# Patient Record
Sex: Male | Born: 1965
Health system: Southern US, Community
[De-identification: ages and names within clinical notes are randomized; demographics above are authoritative.]

## PROBLEM LIST (undated history)

## (undated) DIAGNOSIS — I10 Essential (primary) hypertension: Secondary | ICD-10-CM

## (undated) DIAGNOSIS — R7303 Prediabetes: Secondary | ICD-10-CM

## (undated) DIAGNOSIS — J111 Influenza due to unidentified influenza virus with other respiratory manifestations: Secondary | ICD-10-CM

## (undated) DIAGNOSIS — M779 Enthesopathy, unspecified: Secondary | ICD-10-CM

## (undated) DIAGNOSIS — N200 Calculus of kidney: Secondary | ICD-10-CM

## (undated) HISTORY — DX: Prediabetes: R73.03

## (undated) HISTORY — DX: Essential (primary) hypertension: I10

---

## 2017-01-13 ENCOUNTER — Encounter (HOSPITAL_COMMUNITY): Payer: Self-pay | Admitting: Emergency Medicine

## 2017-01-13 ENCOUNTER — Emergency Department (HOSPITAL_COMMUNITY)
Admission: EM | Admit: 2017-01-13 | Discharge: 2017-01-13 | Disposition: A | Discharge status: Home / Self Care | Payer: Self-pay | Attending: Emergency Medicine | Admitting: Emergency Medicine

## 2017-01-13 DIAGNOSIS — R51 Headache: Secondary | ICD-10-CM | POA: Insufficient documentation

## 2017-01-13 DIAGNOSIS — R200 Anesthesia of skin: Secondary | ICD-10-CM | POA: Insufficient documentation

## 2017-01-13 DIAGNOSIS — F17200 Nicotine dependence, unspecified, uncomplicated: Secondary | ICD-10-CM | POA: Insufficient documentation

## 2017-01-13 DIAGNOSIS — R197 Diarrhea, unspecified: Secondary | ICD-10-CM | POA: Insufficient documentation

## 2017-01-13 DIAGNOSIS — M2550 Pain in unspecified joint: Secondary | ICD-10-CM | POA: Insufficient documentation

## 2017-01-13 DIAGNOSIS — M545 Low back pain: Secondary | ICD-10-CM | POA: Insufficient documentation

## 2017-01-13 HISTORY — DX: Influenza due to unidentified influenza virus with other respiratory manifestations: J11.1

## 2017-01-13 HISTORY — DX: Enthesopathy, unspecified: M77.9

## 2017-01-13 HISTORY — DX: Calculus of kidney: N20.0

## 2017-01-13 LAB — COMPREHENSIVE METABOLIC PANEL
ALK PHOS: 61 U/L (ref 38–126)
ALT: 34 U/L (ref 17–63)
AST: 41 U/L (ref 15–41)
Albumin: 3.8 g/dL (ref 3.5–5.0)
Anion gap: 7 (ref 5–15)
BILIRUBIN TOTAL: 0.6 mg/dL (ref 0.3–1.2)
BUN: 12 mg/dL (ref 6–20)
CHLORIDE: 106 mmol/L (ref 101–111)
CO2: 23 mmol/L (ref 22–32)
CREATININE: 0.86 mg/dL (ref 0.61–1.24)
Calcium: 8.7 mg/dL — ABNORMAL LOW (ref 8.9–10.3)
GFR calc Af Amer: >60 mL/min (ref >60)
Glucose, Bld: 114 mg/dL — ABNORMAL HIGH (ref 65–99)
Potassium: 4 mmol/L (ref 3.5–5.1)
Sodium: 136 mmol/L (ref 135–145)
TOTAL PROTEIN: 6.8 g/dL (ref 6.5–8.1)

## 2017-01-13 LAB — CBC
HCT: 45.1 % (ref 39.0–52.0)
Hemoglobin: 14.8 g/dL (ref 13.0–17.0)
MCH: 29.4 pg (ref 26.0–34.0)
MCHC: 32.8 g/dL (ref 30.0–36.0)
MCV: 89.7 fL (ref 78.0–100.0)
PLATELETS: 230 10*3/uL (ref 150–400)
RBC: 5.03 MIL/uL (ref 4.22–5.81)
RDW: 13 % (ref 11.5–15.5)
WBC: 6.1 10*3/uL (ref 4.0–10.5)

## 2017-01-13 LAB — LIPASE, BLOOD: Lipase: 22 U/L (ref 11–51)

## 2017-01-13 MED ORDER — ACETAMINOPHEN 500 MG PO TABS
1000.0000 mg | ORAL_TABLET | Freq: Once | ORAL | Status: AC
  Administered 2017-01-13: 1000 mg via ORAL
  Filled 2017-01-13: qty 2

## 2017-01-13 NOTE — Discharge Instructions (Signed)
Make sure that you drink at least six 8 ounce glasses of water or Gatorade each day in order to stay well-hydrated. Avoid milk or foods containing most she is or ice cream all having diarrhea. You can take Tylenol for aches and can take Imodium as directed for diarrhea. Call any of the numbers on these discharge instructions to get a primary care physician . Return if concern for any reason or you can see an urgent care center

## 2017-01-13 NOTE — ED Provider Notes (Signed)
[facility] Provider Note   CSN: [id number] Arrival date & time: 01/13/17  1227     History   Chief Complaint Chief Complaint  Patient presents with  . Diarrhea  . Headache  . Numbness    HPI Alex [name] is a 51 y.o. male.Patient complains of diarrhea 7 times today, no blood per rectum, no hematemesis. He vomited one time today. No nausea present. He ate bacon and eggs today. He denies abdominal pain. He also complains of diffuse headache and low back pain for several weeks as well as bilateral ankle pain and swelling for several weeks. He also complains of numbness in little finger of left hand and numbness in index middle and ring fingers of right hand for several weeks. No treatment prior to coming here. No other associated symptoms. No fever no recent travel or antibiotics  HPI  Past Medical History:  Diagnosis Date  . Bone spur   . Flu   . Kidney stone     There are no active problems to display for this patient.   History reviewed. No pertinent surgical history.     Home Medications    Prior to Admission medications   Not on File    Family History No family history on file.  Social History Social History  Substance Use Topics  . Smoking status: Current Some Day Smoker  . Smokeless tobacco: Never Used  . Alcohol use No     Comment: former quit January 2018   Former user of cocaine and marijuana. Last used cocaine end of April 2018. No history of IV drug use  Allergies   Patient has no known allergies.   Review of Systems Review of Systems  Constitutional: Negative.   HENT: Negative.   Respiratory: Negative.   Cardiovascular: Positive for leg swelling.  Gastrointestinal: Negative.   Musculoskeletal: Positive for arthralgias.  Skin: Negative.   Neurological: Positive for numbness and headaches.  Psychiatric/Behavioral: Negative.   All other systems reviewed and are negative.    Physical Exam Updated Vital Signs BP 128/73    Pulse 79   Temp 99.3 F (37.4 C) (Oral)   Resp (!) 24   Ht 6\' 2"  (1.88 m)   Wt 127.5 kg (281 lb)   SpO2 99%   BMI 36.08 kg/m   Physical Exam  Constitutional: He appears well-developed and well-nourished.  HENT:  Head: Normocephalic and atraumatic.  Eyes: Conjunctivae are normal. Pupils are equal, round, and reactive to light.  Neck: Neck supple. No tracheal deviation present. No thyromegaly present.  Cardiovascular: Normal rate and regular rhythm.   No murmur heard. Pulmonary/Chest: Effort normal and breath sounds normal.  Abdominal: Soft. Bowel sounds are normal. He exhibits no distension. There is no tenderness.  Obese  Musculoskeletal: Normal range of motion. He exhibits no edema or tenderness.  Entire spine is nontender. All 4 extremities without redness swelling or tenderness neurovascularly intact. I didn't detect no peripheral edema.  Neurological: He is alert. Coordination normal.  Motor strength 5 over 5 overall. Grip strength 5 over 5 bilaterally. All fingers with good capillary refill.  Skin: Skin is warm and dry. No rash noted.  Psychiatric: He has a normal mood and affect.  Nursing note and vitals reviewed.    ED Treatments / Results  Labs (all labs ordered are listed, but only abnormal results are displayed) Labs Reviewed  COMPREHENSIVE METABOLIC PANEL - Abnormal; Notable for the following:       Result Value   Glucose,  Bld 114 (*)    Calcium 8.7 (*)    All other components within normal limits  LIPASE, BLOOD  CBC   Results for orders placed or performed during the hospital encounter of 01/13/17  Lipase, blood  Result Value Ref Range   Lipase 22 11 - 51 U/L  Comprehensive metabolic panel  Result Value Ref Range   Sodium 136 135 - 145 mmol/L   Potassium 4.0 3.5 - 5.1 mmol/L   Chloride 106 101 - 111 mmol/L   CO2 23 22 - 32 mmol/L   Glucose, Bld 114 (H) 65 - 99 mg/dL   BUN 12 6 - 20 mg/dL   Creatinine, Ser 0.450.86 0.61 - 1.24 mg/dL   Calcium 8.7 (L) 8.9  - 10.3 mg/dL   Total Protein 6.8 6.5 - 8.1 g/dL   Albumin 3.8 3.5 - 5.0 g/dL   AST 41 15 - 41 U/L   ALT 34 17 - 63 U/L   Alkaline Phosphatase 61 38 - 126 U/L   Total Bilirubin 0.6 0.3 - 1.2 mg/dL   GFR calc non Af Amer >60 >60 mL/min   GFR calc Af Amer >60 >60 mL/min   Anion gap 7 5 - 15  CBC  Result Value Ref Range   WBC 6.1 4.0 - 10.5 K/uL   RBC 5.03 4.22 - 5.81 MIL/uL   Hemoglobin 14.8 13.0 - 17.0 g/dL   HCT 40.945.1 81.139.0 - 91.452.0 %   MCV 89.7 78.0 - 100.0 fL   MCH 29.4 26.0 - 34.0 pg   MCHC 32.8 30.0 - 36.0 g/dL   RDW 78.213.0 95.611.5 - 21.315.5 %   Platelets 230 150 - 400 K/uL   No results found. EKG  EKG Interpretation None       Radiology No results found.  Procedures Procedures (including critical care time)  Medications Ordered in ED Medications  acetaminophen (TYLENOL) tablet 1,000 mg (not administered)   Results for orders placed or performed during the hospital encounter of 01/13/17  Lipase, blood  Result Value Ref Range   Lipase 22 11 - 51 U/L  Comprehensive metabolic panel  Result Value Ref Range   Sodium 136 135 - 145 mmol/L   Potassium 4.0 3.5 - 5.1 mmol/L   Chloride 106 101 - 111 mmol/L   CO2 23 22 - 32 mmol/L   Glucose, Bld 114 (H) 65 - 99 mg/dL   BUN 12 6 - 20 mg/dL   Creatinine, Ser 0.860.86 0.61 - 1.24 mg/dL   Calcium 8.7 (L) 8.9 - 10.3 mg/dL   Total Protein 6.8 6.5 - 8.1 g/dL   Albumin 3.8 3.5 - 5.0 g/dL   AST 41 15 - 41 U/L   ALT 34 17 - 63 U/L   Alkaline Phosphatase 61 38 - 126 U/L   Total Bilirubin 0.6 0.3 - 1.2 mg/dL   GFR calc non Af Amer >60 >60 mL/min   GFR calc Af Amer >60 >60 mL/min   Anion gap 7 5 - 15  CBC  Result Value Ref Range   WBC 6.1 4.0 - 10.5 K/uL   RBC 5.03 4.22 - 5.81 MIL/uL   Hemoglobin 14.8 13.0 - 17.0 g/dL   HCT 57.845.1 46.939.0 - 62.952.0 %   MCV 89.7 78.0 - 100.0 fL   MCH 29.4 26.0 - 34.0 pg   MCHC 32.8 30.0 - 36.0 g/dL   RDW 52.813.0 41.311.5 - 24.415.5 %   Platelets 230 150 - 400 K/uL   No results found.  Initial  Impression /  Assessment and Plan / ED Course  I have reviewed the triage vital signs and the nursing notes.  Pertinent labs & imaging results that were available during my care of the patient were reviewed by me and considered in my medical decision making (see chart for details).     Numbness in fingers is nonspecific. He has no motor deficit. I detect no peripheral edema. Exam is essentially normal except for obesity. Neurologic exam is nonfocal and normal. Plan avoid dairy, encourage oral hydration. Tylenol for aches. Imodium for diarrhea. Referral primary care  Final Clinical Impressions(s) / ED Diagnoses  Diagnosis #1 diarrhea #2 arthralgias #3 paresthesia #4 nonspecific headache #5 obesity Final diagnoses:  Diarrhea, unspecified type    New Prescriptions New Prescriptions   No medications on file     Doug Sou, MD 01/13/17 1624

## 2017-01-13 NOTE — ED Triage Notes (Signed)
Pt reports numbness to finger tips and right hand ongoing for a couple of weeks. Diarrhea and headache onset today.

## 2017-01-30 ENCOUNTER — Ambulatory Visit (HOSPITAL_COMMUNITY)
Admission: EM | Admit: 2017-01-30 | Discharge: 2017-01-30 | Disposition: A | Discharge status: Home / Self Care | Payer: Self-pay | Attending: Family Medicine | Admitting: Family Medicine

## 2017-01-30 ENCOUNTER — Encounter (HOSPITAL_COMMUNITY): Payer: Self-pay | Admitting: Emergency Medicine

## 2017-01-30 DIAGNOSIS — R600 Localized edema: Secondary | ICD-10-CM

## 2017-01-30 MED ORDER — FUROSEMIDE 40 MG PO TABS: 40.0000 mg | ORAL_TABLET | Freq: Every day | ORAL | 0 refills | Status: DC

## 2017-01-30 MED ORDER — DICLOFENAC SODIUM 50 MG PO TBEC: 50.0000 mg | DELAYED_RELEASE_TABLET | Freq: Two times a day (BID) | ORAL | 0 refills | Status: DC

## 2017-01-30 NOTE — ED Provider Notes (Signed)
  [facility]   [id number] 01/30/17 Arrival Time: 1719  ASSESSMENT & PLAN:  1. Lower leg edema   - acute on chronic  Meds ordered this encounter  Medications  . furosemide (LASIX) 40 MG tablet    Sig: Take 1 tablet (40 mg total) by mouth daily. Take in the morning.    Dispense:  30 tablet    Refill:  0  . diclofenac (VOLTAREN) 50 MG EC tablet    Sig: Take 1 tablet (50 mg total) by mouth 2 (two) times daily. For inflammation/pain.    Dispense:  20 tablet    Refill:  0   Has compression stockings he will start using once a family member brings them to him. Lasix QD for 3-4 days then stop. May repeat as needed. Monitor BP.  Reviewed expectations re: course of current medical issues. Questions answered. Outlined signs and symptoms indicating need for more acute intervention. Follow up here or in the Emergency Department if worsening. Patient verbalized understanding. After Visit Summary given.  SUBJECTIVE:  Alex [name] is a 51 y.o. male who presents with complaint of fairly longstanding LE edema. Past few years. Has times where it bothers him more, esp with persistent standing. 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 Works at [employer] and stands most of the day. Has used compression stockings in past but they are at his home a few hours away. Occasional LE joint pains. No LE sensation changes. Ambulatory without problem.  ROS: As per HPI.   OBJECTIVE:  Vitals:   01/30/17 1729  BP: (!) 173/68  Pulse: 77  Resp: 20  Temp: 98.5 F (36.9 C)  TempSrc: Oral  SpO2: 98%     General appearance: alert, cooperative, appears stated age and no distress Lungs: clear to auscultation bilaterally Heart: regular rate and rhythm Abdomen: soft, non-tender; bowel sounds normal; no masses,  no organomegaly; no guarding or rebound tenderness Extremities: bilat LE 1+ pitting edema; FROM at knee and ankle Skin: Skin color, texture, turgor normal. No rashes  or lesions Lymph nodes: no lymphadenopathy Neurologic: Alert and oriented X 3, normal strength and tone. Normal symmetric reflexes. Normal gait   No Known Allergies  PMHx, SurgHx, SocialHx, Medications, and Allergies were reviewed in the Visit Navigator and updated as appropriate.       Mardella LaymanHagler, Vikash Nest, MD 01/30/17 [phone]

## 2017-01-30 NOTE — ED Notes (Signed)
Patient discharged by provider.

## 2017-01-30 NOTE — ED Triage Notes (Signed)
[facility] a complaint of pain and swelling to both lower legs.

## 2017-03-19 ENCOUNTER — Emergency Department (HOSPITAL_COMMUNITY)
Admission: EM | Admit: 2017-03-19 | Discharge: 2017-03-19 | Disposition: A | Discharge status: Home / Self Care | Payer: Self-pay | Attending: Emergency Medicine | Admitting: Emergency Medicine

## 2017-03-19 ENCOUNTER — Encounter (HOSPITAL_COMMUNITY): Payer: Self-pay | Admitting: Emergency Medicine

## 2017-03-19 DIAGNOSIS — Y9259 Other trade areas as the place of occurrence of the external cause: Secondary | ICD-10-CM | POA: Insufficient documentation

## 2017-03-19 DIAGNOSIS — S60941A Unspecified superficial injury of left index finger, initial encounter: Secondary | ICD-10-CM | POA: Insufficient documentation

## 2017-03-19 DIAGNOSIS — Y9389 Activity, other specified: Secondary | ICD-10-CM | POA: Insufficient documentation

## 2017-03-19 DIAGNOSIS — Z79899 Other long term (current) drug therapy: Secondary | ICD-10-CM | POA: Insufficient documentation

## 2017-03-19 DIAGNOSIS — W540XXA Bitten by dog, initial encounter: Secondary | ICD-10-CM | POA: Insufficient documentation

## 2017-03-19 DIAGNOSIS — Y992 Volunteer activity: Secondary | ICD-10-CM | POA: Insufficient documentation

## 2017-03-19 DIAGNOSIS — Z87891 Personal history of nicotine dependence: Secondary | ICD-10-CM | POA: Insufficient documentation

## 2017-03-19 DIAGNOSIS — Z23 Encounter for immunization: Secondary | ICD-10-CM | POA: Insufficient documentation

## 2017-03-19 MED ORDER — TETANUS-DIPHTH-ACELL PERTUSSIS 5-2.5-18.5 LF-MCG/0.5 IM SUSP
0.5000 mL | Freq: Once | INTRAMUSCULAR | Status: AC
  Administered 2017-03-19: 0.5 mL via INTRAMUSCULAR
  Filled 2017-03-19: qty 0.5

## 2017-03-19 NOTE — ED Provider Notes (Signed)
[facility] Provider Note   CSN: [id number] Arrival date & time: 03/19/17  1720  By signing my name below, I, Diona BrownerJennifer Gorman, attest that this documentation has been prepared under the direction and in the presence of Mathews RobinsonsJessica Hager [place], PA-C. Electronically Signed: Diona BrownerJennifer Gorman, ED Scribe. 03/19/17. 7:09 PM.  History   Chief Complaint Chief Complaint  Patient presents with  . Animal Bite    HPI Alex Koyanagihomas A [name] is a 51 y.o. male with a PMHx of bilateral leg edema, who presents to the Emergency Department complaining of a sudden dog bite to his left hand that occurred ~ 10:30 am today while working at an [employer]. [facility], for a dog bite on his right hand. He was not given a tetanus shot or rabies vaccine at that time. The vaccination status of both the dogs (chihuahua) is unknown. 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 Tetanus is NOT UTD. No hx of HTN or DM. No allergies to mediations. Pt denies fever, nausea, vomiting, or any other complaints at this time.   The history is provided by the patient. No language interpreter was used.    Past Medical History:  Diagnosis Date  . Bone spur   . Flu   . Kidney stone     There are no active problems to display for this patient.   History reviewed. No pertinent surgical history.     Home Medications    Prior to Admission medications   Medication Sig Start Date End Date Taking? Authorizing Provider  diclofenac (VOLTAREN) 50 MG EC tablet Take 1 tablet (50 mg total) by mouth 2 (two) times daily. For inflammation/pain. 01/30/17   Mardella LaymanHagler, Brian, MD  furosemide (LASIX) 40 MG tablet Take 1 tablet (40 mg total) by mouth daily. Take in the morning. 01/30/17   Mardella LaymanHagler, Brian, MD    Family History No family history on file.  Social History Social History  Substance Use Topics  . Smoking  status: Former Smoker    Quit date: 02/16/2017  . Smokeless tobacco: Never Used  . Alcohol use No     Comment: former quit January 2018     Allergies   Patient has no known allergies.   Review of Systems Review of Systems  Constitutional: Negative for chills and fever.  Respiratory: Negative for shortness of breath and wheezing.   Cardiovascular: Negative for chest pain and palpitations.  Gastrointestinal: Negative for nausea and vomiting.  Musculoskeletal: Negative for arthralgias and myalgias.  Skin: Positive for wound. Negative for color change, pallor and rash.  Neurological: Negative for dizziness, weakness, light-headedness, numbness and headaches.     Physical Exam Updated Vital Signs BP 124/66 (BP Location: Left Arm)   Pulse (!) 58   Temp 98.4 F (36.9 C) (Oral)   Resp 18   Ht 6\' 2"  (1.88 m)   Wt 135.2 kg (298 lb)   SpO2 99%   BMI 38.26 kg/m   Physical Exam  Constitutional: He appears well-developed and well-nourished. No distress.  Patient is afebrile, non-toxic appearing, sitting comfortably in chair in no acute distress.  HENT:  Head: Normocephalic and atraumatic.  Eyes: Conjunctivae are normal.  Neck: Normal range of motion.  Cardiovascular: Normal rate, regular rhythm and normal heart sounds.   Pulmonary/Chest: Effort normal and breath sounds normal.  Abdominal: He exhibits no distension.  Musculoskeletal:  Normal range of motion.  Superficial 2 mm by 1 mm wound to left index finger medially. No bleeding. No evidence of deep puncture. FROM. No swelling or surrounding erythema.   Neurological: He is alert.  Skin: He is not diaphoretic. No pallor.  Psychiatric: He has a normal mood and affect. His behavior is normal.  Nursing note and vitals reviewed.    ED Treatments / Results  DIAGNOSTIC STUDIES: Oxygen Saturation is 99% on RA, normal by my interpretation.   COORDINATION OF CARE: 7:09 PM-Discussed next steps with pt. Pt verbalized  understanding and is agreeable with the plan.   Labs (all labs ordered are listed, but only abnormal results are displayed) Labs Reviewed - No data to display  EKG  EKG Interpretation None       Radiology No results found.  Procedures Procedures (including critical care time)  Medications Ordered in ED Medications  Tdap (BOOSTRIX) injection 0.5 mL (0.5 mLs Intramuscular Given 03/19/17 1927)     Initial Impression / Assessment and Plan / ED Course  I have reviewed the triage vital signs and the nursing notes.  Pertinent labs & imaging results that were available during my care of the patient were reviewed by me and considered in my medical decision making (see chart for details).     Patient presents with small abrasion from chihuahua tooth which occurred earlier today. No deep puncture wound.  Patient reports that the dog is being quarantined and observed at this time.  Tetanus was updated while in the emergency department.  I do not think the patient needs to have rabies series started at this time. Patient was discussed with Dr. Judd Lien who agrees with assessment and plan.  Patient is well-appearing, nontoxic and afebrile. Will discharge patient home with PCP follow-up as needed.  Discussed strict return precautions and advised to return to the emergency department if experiencing any new or worsening symptoms. Instructions were understood and patient agreed with discharge plan.  Final Clinical Impressions(s) / ED Diagnoses   Final diagnoses:  Dog bite, initial encounter    New Prescriptions New Prescriptions   No medications on file   I personally performed the services described in this documentation, which was scribed in my presence. The recorded information has been reviewed and is accurate.     Georgiana Shore, PA-C 03/19/17 1940    Geoffery Lyons, MD 03/19/17 [phone]

## 2017-03-19 NOTE — ED Triage Notes (Signed)
Pt reports dog bite on L hand today while working at [employer]. Pt reports  Dog bite on Thursday on R hand, [facility] but did not receive tetanus or rabies vaccine.  Pt reports vaccination status of dogs unknown.

## 2017-03-19 NOTE — Discharge Instructions (Signed)
As discussed, monitor for any signs of infection. Return to be seen at the area becomes red, swollen, more painful, purulence, we developed a fever, chills or any other concerning symptoms. Your tetanus was updated today.   Follow-up with her primary care provider as needed.

## 2017-03-19 NOTE — ED Notes (Signed)
Patient Alert and oriented X4. Stable and ambulatory. Patient verbalized understanding of the discharge instructions.  Patient belongings were taken by the patient.  

## 2017-04-04 ENCOUNTER — Ambulatory Visit (INDEPENDENT_AMBULATORY_CARE_PROVIDER_SITE_OTHER): Payer: Self-pay | Admitting: Family Medicine

## 2017-04-04 ENCOUNTER — Encounter: Payer: Self-pay | Admitting: Family Medicine

## 2017-04-04 VITALS — BP 142/66 | HR 78 | Temp 98.0°F | Resp 16 | Ht 74.0 in | Wt 299.0 lb

## 2017-04-04 DIAGNOSIS — G629 Polyneuropathy, unspecified: Secondary | ICD-10-CM

## 2017-04-04 DIAGNOSIS — I10 Essential (primary) hypertension: Secondary | ICD-10-CM

## 2017-04-04 DIAGNOSIS — M79671 Pain in right foot: Secondary | ICD-10-CM

## 2017-04-04 DIAGNOSIS — M25562 Pain in left knee: Secondary | ICD-10-CM

## 2017-04-04 DIAGNOSIS — G8929 Other chronic pain: Secondary | ICD-10-CM

## 2017-04-04 DIAGNOSIS — M25561 Pain in right knee: Secondary | ICD-10-CM

## 2017-04-04 DIAGNOSIS — M79641 Pain in right hand: Secondary | ICD-10-CM

## 2017-04-04 LAB — POCT URINALYSIS DIP (DEVICE)
Bilirubin Urine: NEGATIVE
Glucose, UA: NEGATIVE mg/dL
KETONES UR: NEGATIVE mg/dL
Nitrite: NEGATIVE
PROTEIN: NEGATIVE mg/dL
SPECIFIC GRAVITY, URINE: 1.02 (ref 1.005–1.030)
UROBILINOGEN UA: 1 mg/dL (ref 0.0–1.0)
pH: 6.5 (ref 5.0–8.0)

## 2017-04-04 LAB — POCT GLYCOSYLATED HEMOGLOBIN (HGB A1C): Hemoglobin A1C: 6

## 2017-04-04 MED ORDER — HYDROCHLOROTHIAZIDE 25 MG PO TABS: 25.0000 mg | ORAL_TABLET | Freq: Every day | ORAL | 3 refills | Status: DC

## 2017-04-04 MED ORDER — GABAPENTIN 300 MG PO CAPS
300.0000 mg | ORAL_CAPSULE | Freq: Three times a day (TID) | ORAL | 3 refills | Status: DC
Start: 2017-04-04 — End: 2018-12-05

## 2017-04-04 MED ORDER — DICLOFENAC SODIUM 50 MG PO TBEC: 50.0000 mg | DELAYED_RELEASE_TABLET | Freq: Two times a day (BID) | ORAL | 0 refills | Status: DC

## 2017-04-04 NOTE — Patient Instructions (Signed)
Foot, ankle, knee, heel, neck pain I have prescribed you gabapentin 300 mg three times daily. Medication may cause drowsiness, take first dose at bedtime.  I have prescribed Diclofenac 50 mg twice day to reduce inflammation.  I have also prescribed hydrochlorothiazide 25 mg once daily to reduce fluid retention.  Your hemoglobin A1C 6.0 indicates prediabetes. Increase physical activity to prevent progression to diabetes.    Return 1 week for fasting labs ( avoid eating for at least 8 hours) and I will recheck your blood pressure.     Hypertension Hypertension is another name for high blood pressure. High blood pressure forces your heart to work harder to pump blood. This can cause problems over time. There are two numbers in a blood pressure reading. There is a top number (systolic) over a bottom number (diastolic). It is best to have a blood pressure below 120/80. Healthy choices can help lower your blood pressure. You may need medicine to help lower your blood pressure if:  Your blood pressure cannot be lowered with healthy choices.  Your blood pressure is higher than 130/80.  Follow these instructions at home: Eating and drinking  If directed, follow the DASH eating plan. This diet includes: ? Filling half of your plate at each meal with fruits and vegetables. ? Filling one quarter of your plate at each meal with whole grains. Whole grains include whole wheat pasta, brown rice, and whole grain bread. ? Eating or drinking low-fat dairy products, such as skim milk or low-fat yogurt. ? Filling one quarter of your plate at each meal with low-fat (lean) proteins. Low-fat proteins include fish, skinless chicken, eggs, beans, and tofu. ? Avoiding fatty meat, cured and processed meat, or chicken with skin. ? Avoiding premade or processed food.  Eat less than 1,500 mg of salt (sodium) a day.  Limit alcohol use to no more than 1 drink a day for nonpregnant women and 2 drinks a day for men.  One drink equals 12 oz of beer, 5 oz of wine, or 1 oz of hard liquor. Lifestyle  Work with your doctor to stay at a healthy weight or to lose weight. Ask your doctor what the best weight is for you.  Get at least 30 minutes of exercise that causes your heart to beat faster (aerobic exercise) most days of the week. This may include walking, swimming, or biking.  Get at least 30 minutes of exercise that strengthens your muscles (resistance exercise) at least 3 days a week. This may include lifting weights or pilates.  Do not use any products that contain nicotine or tobacco. This includes cigarettes and e-cigarettes. If you need help quitting, ask your doctor.  Check your blood pressure at home as told by your doctor.  Keep all follow-up visits as told by your doctor. This is important. Medicines  Take over-the-counter and prescription medicines only as told by your doctor. Follow directions carefully.  Do not skip doses of blood pressure medicine. The medicine does not work as well if you skip doses. Skipping doses also puts you at risk for problems.  Ask your doctor about side effects or reactions to medicines that you should watch for. Contact a doctor if:  You think you are having a reaction to the medicine you are taking.  You have headaches that keep coming back (recurring).  You feel dizzy.  You have swelling in your ankles.  You have trouble with your vision. Get help right away if:  You get a very  bad headache.  You start to feel confused.  You feel weak or numb.  You feel faint.  You get very bad pain in your: ? Chest. ? Belly (abdomen).  You throw up (vomit) more than once.  You have trouble breathing. Summary  Hypertension is another name for high blood pressure.  Making healthy choices can help lower blood pressure. If your blood pressure cannot be controlled with healthy choices, you may need to take medicine. This information is not intended to  replace advice given to you by your health care provider. Make sure you discuss any questions you have with your health care provider. Document Released: 01/10/2008 Document Revised: 06/21/2016 Document Reviewed: 06/21/2016 Elsevier Interactive Patient Education  2018 [employer].     Prediabetes Prediabetes is the condition of having a blood sugar (blood glucose) level that is higher than it should be, but not high enough for you to be diagnosed with type 2 diabetes. Having prediabetes puts you at risk for developing type 2 diabetes (type 2 diabetes mellitus). Prediabetes may be called impaired glucose tolerance or impaired fasting glucose. Prediabetes usually does not cause symptoms. Your health care provider can diagnose this condition with blood tests. You may be tested for prediabetes if you are overweight and if you have at least one other risk factor for prediabetes. Risk factors for prediabetes include:  Having a family member with type 2 diabetes.  Being overweight or obese.  Being older than age 63.  Being of American-Indian, African-American, Hispanic/Latino, or Asian/Pacific Islander descent.  Having an inactive (sedentary) lifestyle.  Having a history of gestational diabetes or polycystic ovarian syndrome (PCOS).  Having low levels of good cholesterol (HDL-C) or high levels of blood fats (triglycerides).  Having high blood pressure.  What is blood glucose and how is blood glucose measured?  Blood glucose refers to the amount of glucose in your bloodstream. Glucose comes from eating foods that contain sugars and starches (carbohydrates) that the body breaks down into glucose. Your blood glucose level may be measured in mg/dL (milligrams per deciliter) or mmol/L (millimoles per liter).Your blood glucose may be checked with one or more of the following blood tests:  A fasting blood glucose (FBG) test. You will not be allowed to eat (you will fast) for at least 8 hours  before a blood sample is taken. ? A normal range for FBG is 70-100 mg/dl (9.5-6.2 mmol/L).  An A1c (hemoglobin A1c) blood test. This test provides information about blood glucose control over the previous 2?3months.  An oral glucose tolerance test (OGTT). This test measures your blood glucose twice: ? After fasting. This is your baseline level. ? Two hours after you drink a beverage that contains glucose.  You may be diagnosed with prediabetes:  If your FBG is 100?125 mg/dL (1.3-0.8 mmol/L).  If your A1c level is 5.7?6.4%.  If your OGGT result is 140?199 mg/dL (6.5-78 mmol/L).  These blood tests may be repeated to confirm your diagnosis. What happens if blood glucose is too high? The pancreas produces a hormone (insulin) that helps move glucose from the bloodstream into cells. When cells in the body do not respond properly to insulin that the body makes (insulin resistance), excess glucose builds up in the blood instead of going into cells. As a result, high blood glucose (hyperglycemia) can develop, which can cause many complications. This is a symptom of prediabetes. What can happen if blood glucose stays higher than normal for a long time? Having high blood glucose for  a long time is dangerous. Too much glucose in your blood can damage your nerves and blood vessels. Long-term damage can lead to complications from diabetes, which may include:  Heart disease.  Stroke.  Blindness.  Kidney disease.  Depression.  Poor circulation in the feet and legs, which could lead to surgical removal (amputation) in severe cases.  How can prediabetes be prevented from turning into type 2 diabetes?  To help prevent type 2 diabetes, take the following actions:  Be physically active. ? Do moderate-intensity physical activity for at least 30 minutes on at least 5 days of the week, or as much as told by your health care provider. This could be brisk walking, biking, or water aerobics. ? Ask  your health care provider what activities are safe for you. A mix of physical activities may be best, such as walking, swimming, cycling, and strength training.  Lose weight as told by your health care provider. ? Losing 5-7% of your body weight can reverse insulin resistance. ? Your health care provider can determine how much weight loss is best for you and can help you lose weight safely.  Follow a healthy meal plan. This includes eating lean proteins, complex carbohydrates, fresh fruits and vegetables, low-fat dairy products, and healthy fats. ? Follow instructions from your health care provider about eating or drinking restrictions. ? Make an appointment to see a diet and nutrition specialist (registered dietitian) to help you create a healthy eating plan that is right for you.  Do not smoke or use any tobacco products, such as cigarettes, chewing tobacco, and e-cigarettes. If you need help quitting, ask your health care provider.  Take over-the-counter and prescription medicines as told by your health care provider. You may be prescribed medicines that help lower the risk of type 2 diabetes.  This information is not intended to replace advice given to you by your health care provider. Make sure you discuss any questions you have with your health care provider. Document Released: 11/15/2015 Document Revised: 12/30/2015 Document Reviewed: 09/14/2015 Elsevier Interactive Patient Education  [employer].

## 2017-04-04 NOTE — Progress Notes (Signed)
Patient ID: Alex [name], male    DOB: 1966/03/18, 51 y.o.   MRN: [id number]  PCP: Bing Neighbors, FNP  Chief Complaint  Patient presents with  . Establish Care    Subjective:  HPI Alex [name] is a 51 y.o. male with a history of hypertension and chronic MSK pain, presents to establish care.Alex [name] reports last complete physical about 2 years ago. He is chronically prescribed hydrochlorothiazide for hypertension therapy, although he admits that he has been without medication for sometime. Reports recent headaches, occurring upon awakening, occasional blurring of vision. Denies chest pain or shortness of breath. Alex [name] doesn't routinely monitor his blood pressure at home. He is a non-smoker. Alex [name] is a former substance abuser whom currently resides in the [facility] which is a program geared toward substance abuse treatment and he has completed 4/9 month treatment program. As a requirement of the program, he works 7 days per week at the [employer] which is a physically intensive occupation which exacerbates his chronic leg pain. Pain of bilateral knees and bilateral heel pain is the most worrisome regions at present.The pain is most pronounced with prolonged standing and is exacerbated at times when working with aggressive animals. Knee pain is characterized as aching however heel pain is sharp and burning. He also complains today of right hand pain most pronounced in the 5th digit. 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 Reports a locking of the 5th digit when the fingers are positioned in a fist position. This problem has been occurring chronically and he reports no prior evaluation. Social History   Social History  . Marital status: Married    Spouse name: N/A  . Number of children: N/A  . Years of education: N/A   Occupational History  . Not on file.   Social History Main Topics  . Smoking status: Former Smoker     Quit date: 02/16/2017  . Smokeless tobacco: Never Used  . Alcohol use No     Comment: former quit January 2018  . Drug use: No     Comment: last use in April 2018  . Sexual activity: Not on file   Other Topics Concern  . Not on file   Social History Narrative  . No narrative on file    Family History  Problem Relation Age of Onset  . Hypertension Mother   . Kidney disease Mother   . Heart disease Mother   . Cancer Father     Review of Systems  HENT: Positive for sinus pressure.   Eyes: Negative.   Respiratory: Negative.   Cardiovascular: Negative.   Endocrine: Negative.   Genitourinary: Negative.   Musculoskeletal: Positive for arthralgias and back pain.       Bilateral knee and foot pain  Allergic/Immunologic: Negative.   Neurological: Positive for headaches.  Hematological: Negative.   Psychiatric/Behavioral: Negative.     There are no active problems to display for this patient.   No Known Allergies  Prior to Admission medications   Medication Sig Start Date End Date Taking? Authorizing Provider  diclofenac (VOLTAREN) 50 MG EC tablet Take 1 tablet (50 mg total) by mouth 2 (two) times daily. For inflammation/pain. Patient not taking: Reported on 04/04/2017 01/30/17   Mardella Layman, MD  furosemide (LASIX) 40 MG tablet Take 1 tablet (40 mg total) by mouth daily. Take in the morning. Patient not taking: Reported on 04/04/2017 01/30/17  Mardella LaymanHagler, Brian, MD    Past Medical, Surgical Family and Social History reviewed and updated.    Objective:   Today's Vitals   04/04/17 1507  BP: (!) 142/66  Pulse: 78  Resp: 16  Temp: 98 F (36.7 C)  TempSrc: Oral  SpO2: 99%  Weight: 299 lb (135.6 kg)  Height: 6\' 2"  (1.88 m)    Wt Readings from Last 3 Encounters:  04/04/17 299 lb (135.6 kg)  03/19/17 298 lb (135.2 kg)  01/13/17 281 lb (127.5 kg)    Physical Exam  Constitutional: He is oriented to person, place, and time. He appears well-developed and  well-nourished.  HENT:  Head: Normocephalic and atraumatic.  Eyes: Pupils are equal, round, and reactive to light. Conjunctivae and EOM are normal.  Neck: Normal range of motion. Neck supple. No thyromegaly present.  Cardiovascular: Normal rate, normal heart sounds and intact distal pulses.   Pulmonary/Chest: Effort normal and breath sounds normal.  Abdominal: Soft. Bowel sounds are normal.  "central obesity"  Musculoskeletal: Normal range of motion.  Neurological: He is alert and oriented to person, place, and time.  Skin: Skin is warm and dry.  Psychiatric: He has a normal mood and affect. His behavior is normal. Judgment and thought content normal.   Assessment & Plan:  1. Neuropathy, patient is prediabetic, A1C 6.0.  2. Essential hypertension-Resume hydrochlorothiazide 25 mg daily 3. Chronic pain of both knees-Diclofenac 50 mg twice daily as needed for pain 4. Right Hand  pain-Diclofenac 50 mg twice daily as needed for pain 5. Pain of right heel-Diclofenac 50 mg twice daily as needed for pain   Orders Placed This Encounter  Procedures  . POCT urinalysis dip (device)  . POCT glycosylated hemoglobin (Hb A1C)    RTC: 1 week fasting labs, blood pressure check, and EKG  -Complete [place] Financial Assistance Application in order to be referred to orthopedic for further evaluation of knee and hand pain.   Godfrey PickKimberly S. Tiburcio PeaHarris, MSN, FNP-C [facility]  [address] [place], [place] [address] [phone]

## 2017-04-11 ENCOUNTER — Ambulatory Visit (INDEPENDENT_AMBULATORY_CARE_PROVIDER_SITE_OTHER): Payer: Self-pay | Admitting: Family Medicine

## 2017-04-11 ENCOUNTER — Encounter: Payer: Self-pay | Admitting: Family Medicine

## 2017-04-11 VITALS — BP 160/74 | HR 64 | Temp 97.8°F | Resp 16 | Ht 74.0 in

## 2017-04-11 DIAGNOSIS — G8929 Other chronic pain: Secondary | ICD-10-CM | POA: Insufficient documentation

## 2017-04-11 DIAGNOSIS — I10 Essential (primary) hypertension: Secondary | ICD-10-CM

## 2017-04-11 DIAGNOSIS — M25562 Pain in left knee: Secondary | ICD-10-CM

## 2017-04-11 DIAGNOSIS — E7800 Pure hypercholesterolemia, unspecified: Secondary | ICD-10-CM

## 2017-04-11 DIAGNOSIS — M25561 Pain in right knee: Secondary | ICD-10-CM

## 2017-04-11 DIAGNOSIS — M79641 Pain in right hand: Secondary | ICD-10-CM | POA: Insufficient documentation

## 2017-04-11 DIAGNOSIS — M79671 Pain in right foot: Secondary | ICD-10-CM | POA: Insufficient documentation

## 2017-04-11 DIAGNOSIS — G629 Polyneuropathy, unspecified: Secondary | ICD-10-CM | POA: Insufficient documentation

## 2017-04-11 DIAGNOSIS — Z125 Encounter for screening for malignant neoplasm of prostate: Secondary | ICD-10-CM

## 2017-04-11 MED ORDER — AMLODIPINE BESYLATE 5 MG PO TABS: 5.0000 mg | ORAL_TABLET | Freq: Every day | ORAL | 3 refills | Status: DC

## 2017-04-11 NOTE — Progress Notes (Signed)
Patient ID: Alex [name] Killmer, male    DOB: 09/01/1965, 51 y.o.   MRN: [id number]  PCP: Bing NeighborsHarris, Delories Mauri S, FNP  No chief complaint on file.    Subjective:  HPI  Alex [name] Deyoe is [name] 51 y.o. male with hypertension, obesity, hx of substance abuse, prediabetes, presents today for complete physical exam and fasting labs. Alex [name] established in clinic 04/04/2017. He suffers from chronic musculoskeletal pain and neuropathic foot pain. He was recently prescribed Gabapentin for pain and reports some improvement of symptoms. Alex [name] also suffers from hypertension and only recently resumed taking antihypertensive medications. His blood pressure was elevated during his last office visit, although he reports consistently taking his medications over the last week. Alex [name] denies chest pain, dizziness, shortness of breath, or activity intolerance. He is requesting [name] letter today that would authorize him to have one day off work per week due to chornic health conditions to promote rest. He is in [name] substance abuse rehabilitation program which requires him to work at the [employer] 8 hours per day, 7 days per week. Social History   Social History  . Marital status: Married    Spouse name: [name]  . Number of children: [name]  . Years of education: [name]   Occupational History  . Not on file.   Social History Main Topics  . Smoking status: Former Smoker    Quit date: 02/16/2017  . Smokeless tobacco: Never Used  . Alcohol use No     Comment: former quit January 2018  . Drug use: No     Comment: last use in April 2018  . Sexual activity: Not on file   Other Topics Concern  . Not on file   Social History Narrative  . No narrative on file    Family History  Problem Relation Age of Onset  . Hypertension Mother    Review of Systems  Constitutional: Negative.   HENT: Negative.   Respiratory: Negative.   Cardiovascular: Negative.   Gastrointestinal: Negative.   Endocrine: Negative.   Genitourinary:  Negative.   Musculoskeletal: Positive for arthralgias, back pain and myalgias.  Neurological: Positive for numbness.       Bilateral heel numbness and tingling   Hematological: Negative.   Psychiatric/Behavioral: Negative.    See HPI  Prior to Admission medications   Medication Sig Start Date End Date Taking? Authorizing Provider  diclofenac (VOLTAREN) 50 MG EC tablet Take 1 tablet (50 mg total) by mouth 2 (two) times daily. For inflammation/pain. 04/04/17   Bing NeighborsHarris, Hiroto Saltzman S, FNP  furosemide (LASIX) 40 MG tablet Take 1 tablet (40 mg total) by mouth daily. Take in the morning. Patient not taking: Reported on 04/04/2017 01/30/17   Mardella LaymanHagler, Brian, MD  gabapentin (NEURONTIN) 300 MG capsule Take 1 capsule (300 mg total) by mouth 3 (three) times daily. 04/04/17   Bing NeighborsHarris, Tyson Masin S, FNP  hydrochlorothiazide (HYDRODIURIL) 25 MG tablet Take 1 tablet (25 mg total) by mouth daily. 04/04/17   Bing NeighborsHarris, Rayn Shorb S, FNP    Past Medical, Surgical Family and Social History reviewed and updated.    Objective:  There were no vitals filed for this visit.  Wt Readings from Last 3 Encounters:  04/04/17 299 lb (135.6 kg)  03/19/17 298 lb (135.2 kg)  01/13/17 281 lb (127.5 kg)    Physical Exam  Constitutional: He is oriented to person, place, and time. He appears well-developed and well-nourished.  HENT:  Head: Normocephalic and atraumatic.  Right Ear: External ear normal.  Nose:  Nose normal.  Mouth/Throat: Oropharynx is clear and moist.  Eyes: Pupils are equal, round, and reactive to light. Conjunctivae and EOM are normal.  Neck: Normal range of motion. Neck supple. No thyromegaly present.  Cardiovascular: Normal rate, regular rhythm, normal heart sounds and intact distal pulses.   EKG-NSR, negative of ischemic changes   Pulmonary/Chest: Effort normal and breath sounds normal.  Abdominal: Soft. Bowel sounds are normal.  Musculoskeletal: Normal range of motion.  Lymphadenopathy:    He has no cervical  adenopathy.  Neurological: He is alert and oriented to person, place, and time.  Skin: Skin is warm and dry.  Psychiatric: He has [name] normal mood and affect. His behavior is normal. Judgment and thought content normal.   Assessment & Plan:  1. Pure hypercholesterolemia, counseled on reduction of foods high in saturated fat. Increase physical activity to improve weight loss. Lipid panel pending.  2. Essential hypertension, counseled on low sodium. No adding of table salt to meals, iIncrease physical activity to improve weight loss. EKG-NSR . Blood pressure remains uncontrolled. Continue HCTZ and adding amlodipine 5 mg daily.  3. Screening for prostate cancer - PSA  Orders Placed This Encounter  Procedures  . Lipid panel  . CBC with Differential  . COMPLETE METABOLIC PANEL WITH GFR  . Thyroid Panel With TSH  . PSA  . EKG 12-Lead    [place] Patient Assistance Application provided as patient is requesting [name] referral to orthopedic and podiatry for chronic hand, arm, feet, and knee pain discussed during prior visit.   RTC:  3 months for hypertension and prediabetes follow-up. Recheck A1C   Godfrey Pick. Tiburcio Pea, MSN, FNP-C [facility]  [address] [place], [place] [address] [phone]

## 2017-04-12 LAB — CBC WITH DIFFERENTIAL/PLATELET
BASOS PCT: 0.9 %
Basophils Absolute: 58 cells/uL (ref 0–200)
Eosinophils Absolute: 198 cells/uL (ref 15–500)
Eosinophils Relative: 3.1 %
HCT: 42.8 % (ref 38.5–50.0)
HEMOGLOBIN: 14.3 g/dL (ref 13.2–17.1)
Lymphs Abs: 3699 cells/uL (ref 850–3900)
MCH: 29.5 pg (ref 27.0–33.0)
MCHC: 33.4 g/dL (ref 32.0–36.0)
MCV: 88.2 fL (ref 80.0–100.0)
MPV: 9.6 fL (ref 7.5–12.5)
Monocytes Relative: 10.7 %
NEUTROS ABS: 1760 {cells}/uL (ref 1500–7800)
Neutrophils Relative %: 27.5 %
Platelets: 209 10*3/uL (ref 140–400)
RBC: 4.85 10*6/uL (ref 4.20–5.80)
RDW: 12.9 % (ref 11.0–15.0)
TOTAL LYMPHOCYTE: 57.8 %
WBC: 6.4 10*3/uL (ref 3.8–10.8)
WBCMIX: 685 {cells}/uL (ref 200–950)

## 2017-04-12 LAB — LIPID PANEL
CHOLESTEROL: 163 mg/dL (ref <200)
HDL: 38 mg/dL — ABNORMAL LOW (ref >40)
LDL CHOLESTEROL (CALC): 102 mg/dL — AB
Non-HDL Cholesterol (Calc): 125 mg/dL (calc) (ref <130)
Total CHOL/HDL Ratio: 4.3 (calc) (ref <5.0)
Triglycerides: 129 mg/dL (ref <150)

## 2017-04-12 LAB — COMPLETE METABOLIC PANEL WITH GFR
AG Ratio: 1.6 (calc) (ref 1.0–2.5)
ALBUMIN MSPROF: 4.4 g/dL (ref 3.6–5.1)
ALT: 29 U/L (ref 9–46)
AST: 29 U/L (ref 10–35)
Alkaline phosphatase (APISO): 73 U/L (ref 40–115)
BUN: 24 mg/dL (ref 7–25)
CALCIUM: 9.4 mg/dL (ref 8.6–10.3)
CO2: 23 mmol/L (ref 20–32)
Chloride: 102 mmol/L (ref 98–110)
Creat: 1.21 mg/dL (ref 0.70–1.33)
GFR, EST AFRICAN AMERICAN: 79 mL/min/{1.73_m2} (ref >=60)
GFR, EST NON AFRICAN AMERICAN: 68 mL/min/{1.73_m2} (ref >=60)
GLUCOSE: 100 mg/dL — AB (ref 65–99)
Globulin: 2.7 g/dL (calc) (ref 1.9–3.7)
Potassium: 4.1 mmol/L (ref 3.5–5.3)
Sodium: 136 mmol/L (ref 135–146)
TOTAL PROTEIN: 7.1 g/dL (ref 6.1–8.1)
Total Bilirubin: 0.5 mg/dL (ref 0.2–1.2)

## 2017-04-12 LAB — THYROID PANEL WITH TSH
FREE THYROXINE INDEX: 1.8 (ref 1.4–3.8)
T3 Uptake: 31 % (ref 22–35)
T4, Total: 5.8 ug/dL (ref 4.9–10.5)
TSH: 0.71 m[IU]/L (ref 0.40–4.50)

## 2017-04-12 LAB — PSA: PSA: 0.5 ng/mL (ref <=4.0)

## 2017-05-16 ENCOUNTER — Emergency Department (HOSPITAL_COMMUNITY): Payer: Self-pay

## 2017-05-16 ENCOUNTER — Emergency Department (HOSPITAL_COMMUNITY)
Admission: EM | Admit: 2017-05-16 | Discharge: 2017-05-16 | Disposition: A | Discharge status: Home / Self Care | Payer: Self-pay | Attending: Emergency Medicine | Admitting: Emergency Medicine

## 2017-05-16 ENCOUNTER — Encounter (HOSPITAL_COMMUNITY): Payer: Self-pay | Admitting: Emergency Medicine

## 2017-05-16 DIAGNOSIS — Y9389 Activity, other specified: Secondary | ICD-10-CM | POA: Insufficient documentation

## 2017-05-16 DIAGNOSIS — S61451A Open bite of right hand, initial encounter: Secondary | ICD-10-CM | POA: Insufficient documentation

## 2017-05-16 DIAGNOSIS — Z79899 Other long term (current) drug therapy: Secondary | ICD-10-CM | POA: Insufficient documentation

## 2017-05-16 DIAGNOSIS — W540XXA Bitten by dog, initial encounter: Secondary | ICD-10-CM | POA: Insufficient documentation

## 2017-05-16 DIAGNOSIS — Z87891 Personal history of nicotine dependence: Secondary | ICD-10-CM | POA: Insufficient documentation

## 2017-05-16 DIAGNOSIS — Y999 Unspecified external cause status: Secondary | ICD-10-CM | POA: Insufficient documentation

## 2017-05-16 DIAGNOSIS — I10 Essential (primary) hypertension: Secondary | ICD-10-CM | POA: Insufficient documentation

## 2017-05-16 DIAGNOSIS — Y929 Unspecified place or not applicable: Secondary | ICD-10-CM | POA: Insufficient documentation

## 2017-05-16 MED ORDER — IBUPROFEN 800 MG PO TABS: 800.0000 mg | ORAL_TABLET | Freq: Three times a day (TID) | ORAL | 0 refills | Status: DC

## 2017-05-16 MED ORDER — AMOXICILLIN-POT CLAVULANATE 875-125 MG PO TABS: 1.0000 | ORAL_TABLET | Freq: Two times a day (BID) | ORAL | 0 refills | Status: DC

## 2017-05-16 MED ORDER — AMOXICILLIN-POT CLAVULANATE 875-125 MG PO TABS
1.0000 | ORAL_TABLET | Freq: Once | ORAL | Status: AC
  Administered 2017-05-16: 1 via ORAL
  Filled 2017-05-16: qty 1

## 2017-05-16 MED ORDER — IBUPROFEN 800 MG PO TABS
800.0000 mg | ORAL_TABLET | Freq: Once | ORAL | Status: AC
  Administered 2017-05-16: 800 mg via ORAL
  Filled 2017-05-16: qty 1

## 2017-05-16 NOTE — Discharge Instructions (Signed)
1. Tomorrow start dressing changes twice daily. Rinse with warm water and pat dry. Apply antibiotic ointment and reapply dressing.  2. Take antibiotics as prescribed. 3. Call tomorrow for appointment with the hand specialist, Dr. Janee Morn, for recheck within 1-2 days. 4. Return to the emergency department if worsening or signs of infection.

## 2017-05-16 NOTE — ED Provider Notes (Signed)
[facility] Provider Note   CSN: [id number] Arrival date & time: 05/16/17  1723     History   Chief Complaint Chief Complaint  Patient presents with  . Animal Bite    HPI Alex [name] is a 51 y.o. male.  HPI Patient works at the [employer]. His tetanus is up-to-date. A Pit Bull had gotten out of its cage as he was placing its water dish. Was in front of the cage of another [employer] and they were fighting through the fence. The patient tried to pull the one dog away to help it, he reports the dog got scared, quickly bit his right hand and released it. He got a puncture wound in the palm. He reports he promptly cleaned it extensively running water over it for 10 minutes. He then place a dressing. Patient reports that the dog had been at a shelter for about 6 days. 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  Past Medical History:  Diagnosis Date  . Bone spur   . Flu   . Kidney stone     Patient Active Problem List   Diagnosis Date Noted  . Neuropathy 04/11/2017  . Essential hypertension 04/11/2017  . Chronic pain of both knees 04/11/2017  . Right hand pain 04/11/2017  . Pain of right heel 04/11/2017    History reviewed. No pertinent surgical history.     Home Medications    Prior to Admission medications   Medication Sig Start Date End Date Taking? Authorizing Provider  amLODipine (NORVASC) 5 MG tablet Take 1 tablet (5 mg total) by mouth daily. 04/11/17   Bing Neighbors, FNP  amoxicillin-clavulanate (AUGMENTIN) 875-125 MG tablet Take 1 tablet by mouth 2 (two) times daily. One po bid x 7 days 05/16/17   Arby Barrette, MD  diclofenac (VOLTAREN) 50 MG EC tablet Take 1 tablet (50 mg total) by mouth 2 (two) times daily. For inflammation/pain. 04/04/17   Bing Neighbors, FNP  furosemide (LASIX) 40 MG tablet Take 1 tablet (40 mg total) by mouth daily. Take  in the morning. Patient not taking: Reported on 04/04/2017 01/30/17   Mardella Layman, MD  gabapentin (NEURONTIN) 300 MG capsule Take 1 capsule (300 mg total) by mouth 3 (three) times daily. 04/04/17   Bing Neighbors, FNP  hydrochlorothiazide (HYDRODIURIL) 25 MG tablet Take 1 tablet (25 mg total) by mouth daily. 04/04/17   Bing Neighbors, FNP  ibuprofen (ADVIL,MOTRIN) 800 MG tablet Take 1 tablet (800 mg total) by mouth 3 (three) times daily. 05/16/17   Arby Barrette, MD    Family History Family History  Problem Relation Age of Onset  . Hypertension Mother   . Kidney disease Mother   . Heart disease Mother   . Cancer Father     Social History Social History  Substance Use Topics  . Smoking status: Former Smoker    Quit date: 02/16/2017  . Smokeless tobacco: Never Used  . Alcohol use No     Comment: former quit January 2018     Allergies   Patient has no known allergies.   Review of Systems Review of Systems Constitutional: No fevers no chills no malaise Muscular skeletal: No other associated injury, deformity or acute pain.  Physical Exam Updated Vital Signs BP (!) 139/98 (BP Location: Right Arm)   Pulse 66   Temp 98.8 F (37.1 C) (Oral)  Resp 17   Ht  (1.905 m)   Wt 135.6 kg (299 lb)   SpO2 95%   BMI 37.37 kg/m   Physical Exam  Constitutional: He is oriented to person, place, and time. He appears well-developed and well-nourished. No distress.  HENT:  Head: Normocephalic and atraumatic.  Eyes: EOM are normal.  Pulmonary/Chest: Effort normal.  Musculoskeletal:  Patient has a 1 cm flap with puncture to the palm of the right hand. Mild swelling to the dorsum over the thenar area. Normal range of motion of the thumb. See attached images.  Neurological: He is alert and oriented to person, place, and time. No cranial nerve deficit. He exhibits normal muscle tone. Coordination normal.  Skin: Skin is warm and dry.  Psychiatric: He has a normal mood and  affect.         ED Treatments / Results  Labs (all labs ordered are listed, but only abnormal results are displayed) Labs Reviewed - No data to display  EKG  EKG Interpretation None       Radiology Dg Hand Complete Right  Result Date: 05/16/2017 CLINICAL DATA:  Dog bite EXAM: RIGHT HAND - COMPLETE 3+ VIEW COMPARISON:  None. FINDINGS: Negative for fracture or dislocation. No radiopaque foreign body. No soft tissue gas. IMPRESSION: Negative. Electronically Signed   By: Ellery Plunk M.D.   On: 05/16/2017 19:22    Procedures Procedures (including critical care time)  Medications Ordered in ED Medications  amoxicillin-clavulanate (AUGMENTIN) 875-125 MG per tablet 1 tablet (1 tablet Oral Given 05/16/17 2055)  ibuprofen (ADVIL,MOTRIN) tablet 800 mg (800 mg Oral Given 05/16/17 2055)     Initial Impression / Assessment and Plan / ED Course  I have reviewed the triage vital signs and the nursing notes.  Pertinent labs & imaging results that were available during my care of the patient were reviewed by me and considered in my medical decision making (see chart for details).     Final Clinical Impressions(s) / ED Diagnoses   Final diagnoses:  Dog bite of right hand, initial encounter  Patient has dog bite that occurred today. At this time there is not infection. X-ray does not show any bony fracture or retained foreign body. Patient has intact range of motion. He does have mild swelling developing. Wound is cleaned and dressed and Augmentin initiated. Patient's counseled to follow-up with hand surgery. Patient's counseled on risk of infection with bite wounds to the hand. He is a aware of need for close observation and return if symptoms of infection developing.  New Prescriptions New Prescriptions   AMOXICILLIN-CLAVULANATE (AUGMENTIN) 875-125 MG TABLET    Take 1 tablet by mouth 2 (two) times daily. One po bid x 7 days   IBUPROFEN (ADVIL,MOTRIN) 800 MG TABLET    Take 1  tablet (800 mg total) by mouth 3 (three) times daily.     Arby Barrette, MD 05/16/17 2117

## 2017-05-16 NOTE — ED Triage Notes (Signed)
Pt presents with dog bite to right hand. Reports a pitbull bit his hand at 3, then cleaned his hand with medical cleaner and wrapped it to stop the bleeding.

## 2017-06-14 ENCOUNTER — Encounter (HOSPITAL_COMMUNITY): Payer: Self-pay | Admitting: Emergency Medicine

## 2017-06-14 ENCOUNTER — Ambulatory Visit (HOSPITAL_COMMUNITY)
Admission: EM | Admit: 2017-06-14 | Discharge: 2017-06-14 | Disposition: A | Discharge status: Home / Self Care | Payer: Self-pay | Attending: Family Medicine | Admitting: Family Medicine

## 2017-06-14 DIAGNOSIS — R0981 Nasal congestion: Secondary | ICD-10-CM

## 2017-06-14 MED ORDER — GUAIFENESIN ER 600 MG PO TB12: 600.0000 mg | ORAL_TABLET | Freq: Two times a day (BID) | ORAL | 0 refills | Status: DC

## 2017-06-14 MED ORDER — FUROSEMIDE 40 MG PO TABS: 40.0000 mg | ORAL_TABLET | Freq: Every day | ORAL | 0 refills | Status: DC

## 2017-06-14 MED ORDER — FLUTICASONE PROPIONATE 50 MCG/ACT NA SUSP: 1.0000 | Freq: Every day | NASAL | 2 refills | Status: DC

## 2017-06-14 NOTE — ED Provider Notes (Signed)
[facility]    CSN: [id number] Arrival date & time: 06/14/17  1720     History   Chief Complaint Chief Complaint  Patient presents with  . Facial Pain    HPI Leandrew Koyanagihomas A Agro is a 51 y.o. male.   [name] presents with complaints of sinus pressure cor the past 3 days. Rates pain 8/10. Pain to left of face behind eye and to ear. Sound worsens pain. Some nasal drainage. Denies cough, fevers, known ill contacts, sore throats. Tried taking tylenol sinus and ibuprofen which have minimally helped with pain. States has had infections in the past. Used nasal spray in the past but has ran out. Ran out of lasix and PCP appointment not until 12/4.    ROS per HPI.       Past Medical History:  Diagnosis Date  . Bone spur   . Flu   . Kidney stone     Patient Active Problem List   Diagnosis Date Noted  . Neuropathy 04/11/2017  . Essential hypertension 04/11/2017  . Chronic pain of both knees 04/11/2017  . Right hand pain 04/11/2017  . Pain of right heel 04/11/2017    History reviewed. No pertinent surgical history.     Home Medications    Prior to Admission medications   Medication Sig Start Date End Date Taking? Authorizing Provider  amLODipine (NORVASC) 5 MG tablet Take 1 tablet (5 mg total) by mouth daily. 04/11/17  Yes Bing NeighborsHarris, Kimberly S, FNP  diclofenac (VOLTAREN) 50 MG EC tablet Take 1 tablet (50 mg total) by mouth 2 (two) times daily. For inflammation/pain. 04/04/17  Yes Bing NeighborsHarris, Kimberly S, FNP  hydrochlorothiazide (HYDRODIURIL) 25 MG tablet Take 1 tablet (25 mg total) by mouth daily. 04/04/17  Yes Bing NeighborsHarris, Kimberly S, FNP  fluticasone (FLONASE) 50 MCG/ACT nasal spray Place 1 spray daily into both nostrils. 06/14/17   Georgetta HaberBurky, Natalie B, NP  furosemide (LASIX) 40 MG tablet Take 1 tablet (40 mg total) daily by mouth. Take in the morning. 06/14/17   Georgetta HaberBurky, Natalie B, NP  gabapentin (NEURONTIN) 300 MG capsule Take 1 capsule (300 mg total) by mouth 3 (three) times daily.  04/04/17   Bing NeighborsHarris, Kimberly S, FNP  guaiFENesin (MUCINEX) 600 MG 12 hr tablet Take 1 tablet (600 mg total) 2 (two) times daily by mouth. 06/14/17   Georgetta HaberBurky, Natalie B, NP  ibuprofen (ADVIL,MOTRIN) 800 MG tablet Take 1 tablet (800 mg total) by mouth 3 (three) times daily. 05/16/17   Arby BarrettePfeiffer, Marcy, MD    Family History Family History  Problem Relation Age of Onset  . Hypertension Mother   . Kidney disease Mother   . Heart disease Mother   . Cancer Father     Social History Social History   Tobacco Use  . Smoking status: Current Every Day Smoker    Packs/day: 0.10    Types: Cigarettes    Last attempt to quit: 02/16/2017    Years since quitting: 0.3  . Smokeless tobacco: Never Used  Substance Use Topics  . Alcohol use: No    Comment: former quit January 2018  . Drug use: No    Comment: last use in April 2018     Allergies   Patient has no known allergies.   Review of Systems Review of Systems   Physical Exam Triage Vital Signs ED Triage Vitals  Enc Vitals Group     BP 06/14/17 1745 (!) 146/74     Pulse Rate 06/14/17 1742 67  Resp 06/14/17 1742 16     Temp 06/14/17 1742 99.4 F (37.4 C)     Temp Source 06/14/17 1742 Oral     SpO2 06/14/17 1742 99 %     Weight 06/14/17 1742 294 lb (133.4 kg)     Height 06/14/17 1742 6\' 3"  (1.905 m)     Head Circumference --      Peak Flow --      Pain Score 06/14/17 1742 8     Pain Loc --      Pain Edu? --      Excl. in GC? --    No data found.  Updated Vital Signs BP (!) 146/74   Pulse 67   Temp 99.4 F (37.4 C) (Oral)   Resp 16   Ht 6\' 3"  (1.905 m)   Wt 294 lb (133.4 kg)   SpO2 99%   BMI 36.75 kg/m   Visual Acuity Right Eye Distance:   Left Eye Distance:   Bilateral Distance:    Right Eye Near:   Left Eye Near:    Bilateral Near:     Physical Exam  Constitutional: He is oriented to person, place, and time. He appears well-developed and well-nourished.  HENT:  Head: Normocephalic.  Right Ear: Tympanic  membrane, external ear and ear canal normal.  Left Ear: Tympanic membrane, external ear and ear canal normal.  Nose: Right sinus exhibits no maxillary sinus tenderness and no frontal sinus tenderness. Left sinus exhibits maxillary sinus tenderness. Left sinus exhibits no frontal sinus tenderness.  Mouth/Throat: Uvula is midline, oropharynx is clear and moist and mucous membranes are normal. No tonsillar exudate.  Cardiovascular: Normal rate and regular rhythm.  Pulmonary/Chest: Effort normal and breath sounds normal.  Neurological: He is alert and oriented to person, place, and time.  Skin: Skin is warm and dry.  Vitals reviewed.    UC Treatments / Results  Labs (all labs ordered are listed, but only abnormal results are displayed) Labs Reviewed - No data to display  EKG  EKG Interpretation None       Radiology No results found.  Procedures Procedures (including critical care time)  Medications Ordered in UC Medications - No data to display   Initial Impression / Assessment and Plan / UC Course  I have reviewed the triage vital signs and the nursing notes.  Pertinent labs & imaging results that were available during my care of the patient were reviewed by me and considered in my medical decision making (see chart for details).     Three day history of sinus pressure. Afebrile. Supportive cares recommended at this time. mucinex as an expectorant. Daily flonase. Push fluids. Tylenol and/or ibuprofen as needed for pain or fevers. Push fluids to ensure adequate hydration and keep secretions thin. If symptoms worsen or do not improve in the next week to return to be seen or to follow up with PCP. Patient verbalized understanding and agreeable to plan.    Final Clinical Impressions(s) / UC Diagnoses   Final diagnoses:  Sinus congestion    ED Discharge Orders        Ordered    furosemide (LASIX) 40 MG tablet  Daily     06/14/17 1837    fluticasone (FLONASE) 50 MCG/ACT  nasal spray  Daily     06/14/17 1837    guaiFENesin (MUCINEX) 600 MG 12 hr tablet  2 times daily     06/14/17 1837       Controlled Substance Prescriptions [place]  Controlled Substance Registry consulted? Not Applicable   Georgetta HaberBurky, Natalie B, NP 06/14/17 1842

## 2017-06-14 NOTE — ED Triage Notes (Signed)
PT reports facial pain and pressure for 3 days. PT reports recurrent sinus problems.

## 2017-06-14 NOTE — Discharge Instructions (Signed)
Push fluids to keep secretions thin. Mucinex may be helpful to promote drainage. Daily flonase. If worsening of symptoms, develop fevers or increased pain please return to be seen. Please keep your PCP follow up visit 12/4 for med refills

## 2017-07-01 ENCOUNTER — Emergency Department (HOSPITAL_COMMUNITY): Payer: Self-pay

## 2017-07-01 ENCOUNTER — Emergency Department (HOSPITAL_COMMUNITY)
Admission: EM | Admit: 2017-07-01 | Discharge: 2017-07-02 | Disposition: A | Discharge status: Home / Self Care | Payer: Self-pay | Attending: Emergency Medicine | Admitting: Emergency Medicine

## 2017-07-01 ENCOUNTER — Encounter (HOSPITAL_COMMUNITY): Payer: Self-pay | Admitting: Emergency Medicine

## 2017-07-01 ENCOUNTER — Other Ambulatory Visit: Payer: Self-pay

## 2017-07-01 DIAGNOSIS — R339 Retention of urine, unspecified: Secondary | ICD-10-CM | POA: Insufficient documentation

## 2017-07-01 DIAGNOSIS — N200 Calculus of kidney: Secondary | ICD-10-CM | POA: Insufficient documentation

## 2017-07-01 DIAGNOSIS — F17210 Nicotine dependence, cigarettes, uncomplicated: Secondary | ICD-10-CM | POA: Insufficient documentation

## 2017-07-01 DIAGNOSIS — R1032 Left lower quadrant pain: Secondary | ICD-10-CM | POA: Insufficient documentation

## 2017-07-01 DIAGNOSIS — I10 Essential (primary) hypertension: Secondary | ICD-10-CM | POA: Insufficient documentation

## 2017-07-01 DIAGNOSIS — Z79899 Other long term (current) drug therapy: Secondary | ICD-10-CM | POA: Insufficient documentation

## 2017-07-01 LAB — BASIC METABOLIC PANEL
Anion gap: 7 (ref 5–15)
BUN: 15 mg/dL (ref 6–20)
CO2: 26 mmol/L (ref 22–32)
Calcium: 8.7 mg/dL — ABNORMAL LOW (ref 8.9–10.3)
Chloride: 103 mmol/L (ref 101–111)
Creatinine, Ser: 1.02 mg/dL (ref 0.61–1.24)
GFR calc Af Amer: >60 mL/min (ref >60)
GFR calc non Af Amer: >60 mL/min (ref >60)
Glucose, Bld: 105 mg/dL — ABNORMAL HIGH (ref 65–99)
Potassium: 4.1 mmol/L (ref 3.5–5.1)
Sodium: 136 mmol/L (ref 135–145)

## 2017-07-01 LAB — URINALYSIS, ROUTINE W REFLEX MICROSCOPIC
BILIRUBIN URINE: NEGATIVE
GLUCOSE, UA: NEGATIVE mg/dL
HGB URINE DIPSTICK: NEGATIVE
KETONES UR: NEGATIVE mg/dL
Leukocytes, UA: NEGATIVE
Nitrite: NEGATIVE
PROTEIN: NEGATIVE mg/dL
Specific Gravity, Urine: 1.018 (ref 1.005–1.030)
pH: 7 (ref 5.0–8.0)

## 2017-07-01 MED ORDER — ONDANSETRON HCL 4 MG/2ML IJ SOLN
4.0000 mg | Freq: Once | INTRAMUSCULAR | Status: AC
  Administered 2017-07-01: 4 mg via INTRAVENOUS
  Filled 2017-07-01: qty 2

## 2017-07-01 MED ORDER — KETOROLAC TROMETHAMINE 30 MG/ML IJ SOLN
30.0000 mg | Freq: Once | INTRAMUSCULAR | Status: AC
  Administered 2017-07-01: 30 mg via INTRAVENOUS
  Filled 2017-07-01: qty 1

## 2017-07-01 MED ORDER — SODIUM CHLORIDE 0.9 % IV BOLUS (SEPSIS)
500.0000 mL | Freq: Once | INTRAVENOUS | Status: AC
  Administered 2017-07-01: 500 mL via INTRAVENOUS

## 2017-07-01 MED ORDER — HYDROMORPHONE HCL 1 MG/ML IJ SOLN
1.0000 mg | Freq: Once | INTRAMUSCULAR | Status: AC
  Administered 2017-07-01: 1 mg via INTRAVENOUS
  Filled 2017-07-01: qty 1

## 2017-07-01 NOTE — ED Notes (Signed)
Patient transported to CT 

## 2017-07-01 NOTE — ED Triage Notes (Signed)
C/o L flank pain that radiates to L groin x 1 hour.  History of kidney stones.  Denies urinary complaints at this time.

## 2017-07-01 NOTE — ED Notes (Signed)
Patient transported to Ultrasound 

## 2017-07-01 NOTE — ED Notes (Signed)
Pt given urine cup.  States he is unable to urinate at this time.

## 2017-07-01 NOTE — ED Notes (Signed)
Gave patient urinal and reminded him of needing a urine sample

## 2017-07-01 NOTE — ED Provider Notes (Signed)
[facility]ENC[facility] Provider Note   CSN: [id number] Arrival date & time: 07/01/17  1900     History   Chief Complaint Chief Complaint  Patient presents with  . Flank Pain    HPI Alex [name] is a 51 y.o. male with history of recurrent kidney stones who presents with a 1 hour history of left flank and left groin pain.  Patient reports he has not urinated in 2 or 3 hours.  He has not been able to urinate since his pain started.  He denies any nausea or vomiting.  His last stone was about 9 months ago.  He reports today's symptoms feel very similar to his past kidney stones.  He has passed all of his stones in the past on his own.  He denies any fevers.  He denies any chest pain, shortness of breath, abdominal pain.  HPI  Past Medical History:  Diagnosis Date  . Bone spur   . Flu   . Kidney stone     Patient Active Problem List   Diagnosis Date Noted  . Neuropathy 04/11/2017  . Essential hypertension 04/11/2017  . Chronic pain of both knees 04/11/2017  . Right hand pain 04/11/2017  . Pain of right heel 04/11/2017    History reviewed. No pertinent surgical history.     Home Medications    Prior to Admission medications   Medication Sig Start Date End Date Taking? Authorizing Provider  fluticasone (FLONASE) 50 MCG/ACT nasal spray Place 1 spray daily into both nostrils. 06/14/17  Yes Burky, Dorene GrebeNatalie B, NP  furosemide (LASIX) 40 MG tablet Take 1 tablet (40 mg total) daily by mouth. Take in the morning. 06/14/17  Yes Burky, Dorene GrebeNatalie B, NP  amLODipine (NORVASC) 5 MG tablet Take 1 tablet (5 mg total) by mouth daily. Patient not taking: Reported on 07/01/2017 04/11/17   Bing NeighborsHarris, Kimberly S, FNP  gabapentin (NEURONTIN) 300 MG capsule Take 1 capsule (300 mg total) by mouth 3 (three) times daily. Patient not taking: Reported on 07/01/2017 04/04/17   Bing NeighborsHarris, Kimberly S, FNP  guaiFENesin (MUCINEX) 600 MG 12 hr tablet Take 1 tablet (600 mg total) 2 (two)  times daily by mouth. Patient not taking: Reported on 07/01/2017 06/14/17   Linus MakoBurky, Natalie B, NP  hydrochlorothiazide (HYDRODIURIL) 25 MG tablet Take 1 tablet (25 mg total) by mouth daily. Patient not taking: Reported on 07/01/2017 04/04/17   Bing NeighborsHarris, Kimberly S, FNP  ibuprofen (ADVIL,MOTRIN) 800 MG tablet Take 1 tablet (800 mg total) by mouth 3 (three) times daily. 07/02/17   Eula Mazzola, [name], PA-C  tamsulosin (FLOMAX) 0.4 MG CAPS capsule Take 1 capsule (0.4 mg total) by mouth daily after supper. 07/02/17   Emi HolesLaw, Mckaylin Bastien M, PA-C    Family History Family History  Problem Relation Age of Onset  . Hypertension Mother   . Kidney disease Mother   . Heart disease Mother   . Cancer Father     Social History Social History   Tobacco Use  . Smoking status: Current Every Day Smoker    Packs/day: 0.10    Types: Cigarettes    Last attempt to quit: 02/16/2017    Years since quitting: 0.3  . Smokeless tobacco: Never Used  Substance Use Topics  . Alcohol use: No    Comment: former quit January 2018  . Drug use: No    Comment: last use in April 2018     Allergies   Patient has no known allergies.   Review  of Systems Review of Systems  Constitutional: Negative for chills and fever.  HENT: Negative for facial swelling and sore throat.   Respiratory: Negative for shortness of breath.   Cardiovascular: Negative for chest pain.  Gastrointestinal: Negative for abdominal pain, nausea and vomiting.  Genitourinary: Positive for decreased urine volume, difficulty urinating and flank pain (L). Negative for discharge, dysuria, penile pain, penile swelling and scrotal swelling.  Musculoskeletal: Negative for back pain.  Skin: Negative for rash and wound.  Neurological: Negative for headaches.  Psychiatric/Behavioral: The patient is not nervous/anxious.      Physical Exam Updated Vital Signs BP (!) 109/49   Pulse 64   Temp 98.1 F (36.7 C) (Oral)   SpO2 97%   Physical Exam    Constitutional: He appears well-developed and well-nourished. No distress.  HENT:  Head: Normocephalic and atraumatic.  Mouth/Throat: Oropharynx is clear and moist. No oropharyngeal exudate.  Eyes: Conjunctivae are normal. Pupils are equal, round, and reactive to light. Right eye exhibits no discharge. Left eye exhibits no discharge. No scleral icterus.  Neck: Normal range of motion. Neck supple. No thyromegaly present.  Cardiovascular: Normal rate, regular rhythm, normal heart sounds and intact distal pulses. Exam reveals no gallop and no friction rub.  No murmur heard. Pulmonary/Chest: Effort normal and breath sounds normal. No stridor. No respiratory distress. He has no wheezes. He has no rales.  Abdominal: Soft. Bowel sounds are normal. He exhibits no distension. There is no tenderness. There is no rebound, no guarding and no CVA tenderness.  Genitourinary: Testes normal and penis normal. Right testis shows no swelling and no tenderness. Left testis shows no swelling and no tenderness.     Musculoskeletal: He exhibits no edema.  Lymphadenopathy:    He has no cervical adenopathy.  Neurological: He is alert. Coordination normal.  Skin: Skin is warm and dry. No rash noted. He is not diaphoretic. No pallor.  Psychiatric: He has a normal mood and affect.  Nursing note and vitals reviewed.    ED Treatments / Results  Labs (all labs ordered are listed, but only abnormal results are displayed) Labs Reviewed  URINALYSIS, ROUTINE W REFLEX MICROSCOPIC - Abnormal; Notable for the following components:      Result Value   APPearance HAZY (*)    All other components within normal limits  BASIC METABOLIC PANEL - Abnormal; Notable for the following components:   Glucose, Bld 105 (*)    Calcium 8.7 (*)    All other components within normal limits    EKG  EKG Interpretation None       Radiology Dg Abdomen 1 View  Result Date: 07/01/2017 CLINICAL DATA:  Left lower quadrant  abdominal pain EXAM: ABDOMEN - 1 VIEW COMPARISON:  07/01/2017 ultrasound FINDINGS: Bowel gas pattern is unremarkable.  Small pelvic calcification. IMPRESSION: Small pelvic calcifications may reflect phleboliths or possible ureteral stone. CT KUB would better localize the calcifications. Electronically Signed   By: Jasmine Pang M.D.   On: 07/01/2017 23:09   US Renal  Result Date: 07/01/2017 CLINICAL DATA:  Left flank pain. EXAM: RENAL / URINARY TRACT ULTRASOUND COMPLETE COMPARISON:  None. FINDINGS: Right Kidney: Length: 12.7 cm. Normal renal cortical thickness and echogenicity. No hydronephrosis. There is a 7 mm echogenic focus within the midpole of the right kidney, potentially representing a stone. Left Kidney: Length: 13.6 cm. Normal renal cortical thickness and echogenicity. There is an 8 mm echogenic focus within the inferior pole suggestive of stone. Moderate hydronephrosis. Bladder: There is an  echogenic focus within the urinary bladder which is nonspecific in etiology. IMPRESSION: 1. Mild to moderate left hydronephrosis. 2. Echogenic focus within the urinary bladder is nonspecific. Bladder stone is not excluded. 3. Consider further evaluation with renal stone protocol CT given above findings. Electronically Signed   By: Annia Beltrew  Davis M.D.   On: 07/01/2017 21:44   Ct Renal Stone Study  Result Date: 07/02/2017 CLINICAL DATA:  Left flank pain going to the groin EXAM: CT ABDOMEN AND PELVIS WITHOUT CONTRAST TECHNIQUE: Multidetector CT imaging of the abdomen and pelvis was performed following the standard protocol without IV contrast. COMPARISON:  Radiograph and ultrasound 07/01/2017 FINDINGS: Lower chest: No acute abnormality. Hepatobiliary: No focal liver abnormality is seen. No gallstones, gallbladder wall thickening, or biliary dilatation. Pancreas: Unremarkable. No pancreatic ductal dilatation or surrounding inflammatory changes. Spleen: Normal in size without focal abnormality. Adrenals/Urinary  Tract: Adrenal glands are within normal limits. 6 mm stone lower pole right kidney with no hydronephrosis. Moderate left perinephric fat stranding. Moderate left hydronephrosis and hydroureter. 4 mm stone lower pole left kidney. 7 mm stone along the left posterior bladder, either recently passed or impending passage. Bladder otherwise normal Stomach/Bowel: Stomach is within normal limits. Appendix appears normal. No evidence of bowel wall thickening, distention, or inflammatory changes. Sigmoid colon diverticular disease without acute inflammation Vascular/Lymphatic: No significant vascular findings are present. No enlarged abdominal or pelvic lymph nodes. Reproductive: Prostate is unremarkable. Other: Small fat in the right inguinal canal. Negative for free air or free fluid Musculoskeletal: No acute or significant osseous findings. Degenerative changes IMPRESSION: 1. Moderate left hydronephrosis and hydroureter with perinephric fat stranding. There is a 7 mm stone within the left aspect of the bladder which is either recently passed or impending passage. 2. Bilateral intrarenal calculi 3. Sigmoid colon diverticular disease without acute inflammation Electronically Signed   By: Jasmine PangKim  Fujinaga M.D.   On: 07/02/2017 00:04    Procedures Procedures (including critical care time)  Medications Ordered in ED Medications  HYDROmorphone (DILAUDID) injection 1 mg (1 mg Intravenous Given 07/01/17 1949)  sodium chloride 0.9 % bolus 500 mL (0 mLs Intravenous Stopped 07/01/17 2034)  ondansetron (ZOFRAN) injection 4 mg (4 mg Intravenous Given 07/01/17 1948)  ketorolac (TORADOL) 30 MG/ML injection 30 mg (30 mg Intravenous Given 07/01/17 2118)     Initial Impression / Assessment and Plan / ED Course  I have reviewed the triage vital signs and the nursing notes.  Pertinent labs & imaging results that were available during my care of the patient were reviewed by me and considered in my medical decision making (see  chart for details).     Patient with bladder stone.  Initially attempting to avoid CT considering patient's report of several previous stones (although no records [place]), renal ultrasound and KUB are inconclusive and radiologist recommending CT.  CT renal stone shows moderate left hydronephrosis and hydroureter with perinephric fat stranding and 7 mm stone within the left aspect of the bladder which is either recently passed or impending passage; bilateral intrarenal calculi; sigmoid: Diverticular disease without acute inflammation.  Patient is feeling much better after Dilaudid and Toradol.  Will discharge home with follow-up to urology.  Discharge home with ibuprofen and Flomax.  Return precautions discussed.  Patient understands and agrees with plan.  Patient vitals stable throughout ED course and discharged in satisfactory condition. I discussed patient case with Dr. Dalene SeltzerSchlossman who guided the patient's management and agrees with plan.  Final Clinical Impressions(s) / ED  Diagnoses   Final diagnoses:  Nephrolithiasis    ED Discharge Orders        Ordered    ibuprofen (ADVIL,MOTRIN) 800 MG tablet  3 times daily     07/02/17 0024    tamsulosin (FLOMAX) 0.4 MG CAPS capsule  Daily after supper     07/02/17 0024       Emi Holes, PA-C 07/02/17 0032    Alvira Monday, MD 07/05/17 [phone]

## 2017-07-02 MED ORDER — TAMSULOSIN HCL 0.4 MG PO CAPS: 0.4000 mg | ORAL_CAPSULE | Freq: Every day | ORAL | 0 refills | Status: DC

## 2017-07-02 MED ORDER — IBUPROFEN 800 MG PO TABS: 800.0000 mg | ORAL_TABLET | Freq: Three times a day (TID) | ORAL | 0 refills | Status: DC

## 2017-07-02 NOTE — ED Notes (Signed)
Pt understood dc material. NAD Noted. Scripts and work excuse given at dc 

## 2017-07-02 NOTE — Discharge Instructions (Signed)
Take ibuprofen every 8 hours as needed for your pain.  You can alternate with Tylenol as prescribed over-the-counter.  Take Flomax once daily after dinner until you have passed the stone.  Please follow-up with the urologist within 1 week.  Please return to the emergency department if you develop any new or worsening symptoms.

## 2017-07-11 ENCOUNTER — Ambulatory Visit: Payer: Self-pay | Admitting: Family Medicine

## 2017-07-13 ENCOUNTER — Ambulatory Visit (INDEPENDENT_AMBULATORY_CARE_PROVIDER_SITE_OTHER): Payer: Self-pay | Admitting: Family Medicine

## 2017-07-13 ENCOUNTER — Encounter: Payer: Self-pay | Admitting: Family Medicine

## 2017-07-13 VITALS — BP 124/70 | HR 82 | Temp 98.3°F | Resp 16 | Ht 75.0 in | Wt 293.0 lb

## 2017-07-13 DIAGNOSIS — Z79899 Other long term (current) drug therapy: Secondary | ICD-10-CM

## 2017-07-13 DIAGNOSIS — I10 Essential (primary) hypertension: Secondary | ICD-10-CM

## 2017-07-13 DIAGNOSIS — N200 Calculus of kidney: Secondary | ICD-10-CM

## 2017-07-13 DIAGNOSIS — Z5181 Encounter for therapeutic drug level monitoring: Secondary | ICD-10-CM

## 2017-07-13 DIAGNOSIS — R7303 Prediabetes: Secondary | ICD-10-CM

## 2017-07-13 LAB — POCT GLYCOSYLATED HEMOGLOBIN (HGB A1C): HEMOGLOBIN A1C: 6.1

## 2017-07-13 MED ORDER — POTASSIUM CHLORIDE CRYS ER 20 MEQ PO TBCR: 20.0000 meq | EXTENDED_RELEASE_TABLET | Freq: Every day | ORAL | 2 refills | Status: DC

## 2017-07-13 MED ORDER — NAPROXEN 500 MG PO TABS: 500.0000 mg | ORAL_TABLET | Freq: Two times a day (BID) | ORAL | 0 refills | Status: DC

## 2017-07-13 MED ORDER — AMLODIPINE BESYLATE 5 MG PO TABS: 5.0000 mg | ORAL_TABLET | Freq: Every day | ORAL | 3 refills | Status: DC

## 2017-07-13 MED ORDER — FUROSEMIDE 20 MG PO TABS: 40.0000 mg | ORAL_TABLET | Freq: Every day | ORAL | 0 refills | Status: DC | PRN

## 2017-07-13 MED ORDER — HYDROCHLOROTHIAZIDE 25 MG PO TABS: 25.0000 mg | ORAL_TABLET | Freq: Every day | ORAL | 3 refills | Status: DC

## 2017-07-13 NOTE — Progress Notes (Signed)
Patient ID: Alex [name], male    DOB: 12/04/1965, 51 y.o.   MRN: [id number]  PCP: Bing NeighborsHarris, Wolfgang Finigan S, FNP  Chief Complaint  Patient presents with  . Follow-up    3 MONTH    Subjective:  HPI Alex [name] is a 51 y.o. male presents for evaluation of renal stone, hypertension, and prediabetes. Medical history significant for hypertension, hyperlipemia, prediabetes and obesity. Of recent, 07/01/2017, [name] with a compliant of left flank pain and left groin pain. CT revealed a renal stone with moderate left hydronephrosis and hydroureter with perinephric fat stranding and 7 mm stone within the left aspect of the bladder which is either recently passed or impending passage; bilateral intrarenal calculi; sigmoid: Diverticular disease without acute inflammation. Alex [name] reports the passage of one stone at home on 07/03/2017. and he has retained the stone content however did not bring with him today. He has followed with [facility], Rhoderick Moodyhristopher Winter, however was unable to schedule an appointment due to uninsured and unable to pay the initial visit fee upfront. He is applying for [facility]. He denies hematuria, flank pain, fever, chills, or abdominal pain.  Hypertension/Prediabetes Alex Koyanagihomas A Hodgkiss reports no home monitoring of blood pressure. Reports adherence to blood pressure medications. Reports efforts to adhere to low sodium diet. He is a current smoker. Alex [name] is a prediabetic. Due to chronic musculoskeletal pain he is unable to exercise. Current Body mass index is 36.62 kg/m. Denies any episodes of dizziness,headaches, shortness of breath, or chest pain. Alex [name] also shares a concern that he suffers from sickle cell disease . He reports he was told he has sickle cell disease during a recent ED visit. He has no parental family history, no prior episodes of anemia, or positive hemoglobinopathy.  Social History   Socioeconomic History  .  Marital status: Married    Spouse name: Not on file  . Number of children: Not on file  . Years of education: Not on file  . Highest education level: Not on file  Social Needs  . Financial resource strain: Not on file  . Food insecurity - worry: Not on file  . Food insecurity - inability: Not on file  . Transportation needs - medical: Not on file  . Transportation needs - non-medical: Not on file  Occupational History  . Not on file  Tobacco Use  . Smoking status: Current Every Day Smoker    Packs/day: 0.10    Types: Cigarettes    Last attempt to quit: 02/16/2017    Years since quitting: 0.4  . Smokeless tobacco: Never Used  Substance and Sexual Activity  . Alcohol use: No    Comment: former quit January 2018  . Drug use: No    Comment: last use in April 2018  . Sexual activity: Not on file  Other Topics Concern  . Not on file  Social History Narrative  . Not on file    Family History  Problem Relation Age of Onset  . Hypertension Mother   . Kidney disease Mother   . Heart disease Mother   . Cancer Father    Review of Systems  Constitutional: Negative.   HENT: Negative.   Respiratory: Negative.   Cardiovascular: Negative.   Gastrointestinal: Negative.   Genitourinary: Negative for decreased urine volume.       Recent passage of renal stone   Neurological: Negative.   Psychiatric/Behavioral: Negative.    Patient Active Problem List  Diagnosis Date Noted  . Neuropathy 04/11/2017  . Essential hypertension 04/11/2017  . Chronic pain of both knees 04/11/2017  . Right hand pain 04/11/2017  . Pain of right heel 04/11/2017    No Known Allergies  Prior to Admission medications   Medication Sig Start Date End Date Taking? Authorizing Provider  amLODipine (NORVASC) 5 MG tablet Take 1 tablet (5 mg total) by mouth daily. 04/11/17  Yes Bing NeighborsHarris, Lucus Lambertson S, FNP  furosemide (LASIX) 40 MG tablet Take 1 tablet (40 mg total) daily by mouth. Take in the morning. 06/14/17  Yes  Burky, Barron AlvineNatalie B, NP  guaiFENesin (MUCINEX) 600 MG 12 hr tablet Take 1 tablet (600 mg total) 2 (two) times daily by mouth. 06/14/17  Yes Burky, Dorene GrebeNatalie B, NP  hydrochlorothiazide (HYDRODIURIL) 25 MG tablet Take 1 tablet (25 mg total) by mouth daily. 04/04/17  Yes Bing NeighborsHarris, Jeffry Vogelsang S, FNP  tamsulosin (FLOMAX) 0.4 MG CAPS capsule Take 1 capsule (0.4 mg total) by mouth daily after supper. 07/02/17  Yes Law, Alexandra M, PA-C  fluticasone (FLONASE) 50 MCG/ACT nasal spray Place 1 spray daily into both nostrils. Patient not taking: Reported on 07/13/2017 06/14/17   Linus MakoBurky, Natalie B, NP  gabapentin (NEURONTIN) 300 MG capsule Take 1 capsule (300 mg total) by mouth 3 (three) times daily. Patient not taking: Reported on 07/13/2017 04/04/17   Bing NeighborsHarris, Enyla Lisbon S, FNP  ibuprofen (ADVIL,MOTRIN) 800 MG tablet Take 1 tablet (800 mg total) by mouth 3 (three) times daily. Patient not taking: Reported on 07/13/2017 07/02/17   Emi HolesLaw, Alexandra M, PA-C    Past Medical, Surgical Family and Social History reviewed and updated.    Objective:   Today's Vitals   07/13/17 1323  BP: 124/70  Pulse: 82  Resp: 16  Temp: 98.3 F (36.8 C)  TempSrc: Oral  SpO2: 99%  Weight: 293 lb (132.9 kg)  Height: 6\' 3"  (1.905 m)    Wt Readings from Last 3 Encounters:  07/13/17 293 lb (132.9 kg)  06/14/17 294 lb (133.4 kg)  05/16/17 299 lb (135.6 kg)    Physical Exam Constitutional: Patient appears well-developed and well-nourished. No distress. HENT: Normocephalic, atraumatic, External right and left ear normal. Oropharynx is clear and moist.  Eyes: Conjunctivae and EOM are normal. PERRLA, no scleral icterus. Neck: Normal ROM. Neck supple. No JVD. No tracheal deviation. No thyromegaly. CVS: RRR, S1/S2 +, no murmurs, no gallops, no carotid bruit.  Pulmonary: Effort and breath sounds normal, no stridor, rhonchi, wheezes, rales.  Abdominal: Soft. BS +, no distension, tenderness, rebound or guarding.  Musculoskeletal: Normal range  of motion. No edema and no tenderness.  Lymphadenopathy: No lymphadenopathy noted, cervical, inguinal or axillary Neuro: Alert. Normal reflexes, muscle tone coordination. No cranial nerve deficit. Skin: Skin is warm and dry. No rash noted. Not diaphoretic. No erythema. No pallor. Psychiatric: Normal mood and affect. Behavior, judgment, thought content normal.        Assessment & Plan:  1. Prediabetes, A1C increased to 6.1. Encouraged some form of physical activity. Reduce intake of starchy and foods rich in simple sugars.  2. Nephrolithiasis, recent CT confirmed the presence of bilateral renal calculi. Recently completed Flomax. Currently asymptomatic. Will place a referral to Urology and advised patient to contact office once financial assistance has been approved.   3. Encounter for monitoring diuretic therapy, currently prescribed chronic furosemide with potassium replacement.  Will check BMP today.  4. Essential hypertension, well controlled today. We have discussed target BP range and blood pressure goal. I have  advised patient to check BP regularly and to call us back or report to clinic if the numbers are consistently higher than 140/90. We discussed the importance of compliance with medical therapy and DASH diet recommended, consequences of uncontrolled hypertension discussed.   - continue current BP medications   Advised patient that he has no diagnosis of sickle cell anemia nor symptoms that would lend to a work-up of diagnosis.    Meds ordered this encounter  Medications  . furosemide (LASIX) 20 MG tablet    Sig: Take 2 tablets (40 mg total) by mouth daily as needed. Take in the morning.    Dispense:  90 tablet    Refill:  0    Order Specific Question:   Supervising Provider    Answer:   Quentin Angst [id number]  . potassium chloride SA (K-DUR,KLOR-CON) 20 MEQ tablet    Sig: Take 1 tablet (20 mEq total) by mouth daily.    Dispense:  90 tablet    Refill:  2     Order Specific Question:   Supervising Provider    Answer:   Quentin Angst [id number]  . amLODipine (NORVASC) 5 MG tablet    Sig: Take 1 tablet (5 mg total) by mouth daily.    Dispense:  90 tablet    Refill:  3    Order Specific Question:   Supervising Provider    Answer:   Quentin Angst [id number]  . hydrochlorothiazide (HYDRODIURIL) 25 MG tablet    Sig: Take 1 tablet (25 mg total) by mouth daily.    Dispense:  90 tablet    Refill:  3    Order Specific Question:   Supervising Provider    Answer:   Quentin Angst [id number]  . naproxen (NAPROSYN) 500 MG tablet    Sig: Take 1 tablet (500 mg total) by mouth 2 (two) times daily with a meal.    Dispense:  30 tablet    Refill:  0    Order Specific Question:   Supervising Provider    Answer:   Quentin Angst [[id number]]    Orders Placed This Encounter  Procedures  . Basic metabolic panel  . Ambulatory referral to Urology  . POCT glycosylated hemoglobin (Hb A1C)  . POCT urinalysis dip (device)   RTC: 3 months for chronic condition follow-up   Godfrey Pick. Tiburcio Pea, MSN, FNP-C [facility]  [address] [place], [place] [address] [phone]

## 2017-07-13 NOTE — Patient Instructions (Addendum)
Continue blood pressure medication as prescribed.  I have changed your Furosemide (fluid pill) to 20 mg as needed for lower leg swelling and take a potassium supplement with each dose of Furosemide to prevent low potassium levels.  I am checking a potassium level ammonia today and will notify you if the level is low.  As soon as the [facility] financial assistance application is complete please notify me here at the office so that we can get you establish with [facility].  For pain I am sending over a prescription for Naproxen 500 mg twice daily as needed with food.    Renal Colic Renal colic is pain that is caused by a kidney stone. Follow these instructions at home:  Take medicines only as told by your doctor.  Ask your doctor if it is okay to take over-the-counter medicine for pain.  Drink enough fluid to keep your pee (urine) clear or pale yellow. Drink 6-8 glasses of water each day.  Eat less than 2 grams of salt per day.  Eat less protein. Some foods that have protein are meats, fish, nuts, and dairy.  Try not to eat spinach, rhubarb, nuts, or bran. Contact a doctor if:  You have a fever or chills.  Your pee smells bad or looks cloudy.  You have pain or burning when you pee. Get help right away if:  The pain in your side (flank) or your groin suddenly gets worse.  You get confused.  You pass out. This information is not intended to replace advice given to you by your health care provider. Make sure you discuss any questions you have with your health care provider. Document Released: 01/10/2008 Document Revised: 12/30/2015 Document Reviewed: 06/03/2014 Elsevier Interactive Patient Education  2018 [employer]

## 2017-07-14 LAB — BASIC METABOLIC PANEL
BUN/Creatinine Ratio: 19 (ref 9–20)
BUN: 17 mg/dL (ref 6–24)
CALCIUM: 9.4 mg/dL (ref 8.7–10.2)
CO2: 26 mmol/L (ref 20–29)
Chloride: 102 mmol/L (ref 96–106)
Creatinine, Ser: 0.9 mg/dL (ref 0.76–1.27)
GFR, EST AFRICAN AMERICAN: 112 mL/min/{1.73_m2} (ref >59)
GFR, EST NON AFRICAN AMERICAN: 97 mL/min/{1.73_m2} (ref >59)
Glucose: 98 mg/dL (ref 65–99)
Potassium: 4.4 mmol/L (ref 3.5–5.2)
Sodium: 142 mmol/L (ref 134–144)

## 2017-07-16 LAB — POCT URINALYSIS DIP (DEVICE)
Bilirubin Urine: NEGATIVE
Glucose, UA: NEGATIVE mg/dL
HGB URINE DIPSTICK: NEGATIVE
Ketones, ur: NEGATIVE mg/dL
LEUKOCYTES UA: NEGATIVE
NITRITE: NEGATIVE
Protein, ur: NEGATIVE mg/dL
Specific Gravity, Urine: 1.025 (ref 1.005–1.030)
UROBILINOGEN UA: 0.2 mg/dL (ref 0.0–1.0)
pH: 5.5 (ref 5.0–8.0)

## 2017-08-06 ENCOUNTER — Other Ambulatory Visit: Payer: Self-pay

## 2017-08-06 ENCOUNTER — Encounter (HOSPITAL_COMMUNITY): Payer: Self-pay | Admitting: Emergency Medicine

## 2017-08-06 ENCOUNTER — Emergency Department (HOSPITAL_COMMUNITY): Payer: Self-pay

## 2017-08-06 ENCOUNTER — Emergency Department (HOSPITAL_COMMUNITY)
Admission: EM | Admit: 2017-08-06 | Discharge: 2017-08-06 | Disposition: A | Discharge status: Home / Self Care | Payer: Self-pay | Attending: Emergency Medicine | Admitting: Emergency Medicine

## 2017-08-06 DIAGNOSIS — Z79899 Other long term (current) drug therapy: Secondary | ICD-10-CM | POA: Insufficient documentation

## 2017-08-06 DIAGNOSIS — M25532 Pain in left wrist: Secondary | ICD-10-CM | POA: Insufficient documentation

## 2017-08-06 DIAGNOSIS — I10 Essential (primary) hypertension: Secondary | ICD-10-CM | POA: Insufficient documentation

## 2017-08-06 DIAGNOSIS — F17210 Nicotine dependence, cigarettes, uncomplicated: Secondary | ICD-10-CM | POA: Insufficient documentation

## 2017-08-06 MED ORDER — TRAMADOL HCL 50 MG PO TABS
50.0000 mg | ORAL_TABLET | Freq: Once | ORAL | Status: AC
  Administered 2017-08-06: 50 mg via ORAL
  Filled 2017-08-06: qty 1

## 2017-08-06 MED ORDER — IBUPROFEN 200 MG PO TABS
600.0000 mg | ORAL_TABLET | Freq: Once | ORAL | Status: AC
  Administered 2017-08-06: 600 mg via ORAL
  Filled 2017-08-06: qty 1

## 2017-08-06 MED ORDER — TRAMADOL HCL 50 MG PO TABS: 50.0000 mg | ORAL_TABLET | Freq: Four times a day (QID) | ORAL | 0 refills | Status: DC | PRN

## 2017-08-06 NOTE — ED Triage Notes (Signed)
Pt reports falling 3 months ago onto left wrist, states he did not see a doctor after, 2 weeks ago he bent down to get a food bowl out of his dogs kennel and braced himself with left wrist, has had pain and swelling since.

## 2017-08-06 NOTE — ED Provider Notes (Signed)
[facility] Provider Note   CSN: [id number] Arrival date & time: 08/06/17  1717     History   Chief Complaint Chief Complaint  Patient presents with  . Wrist Pain    HPI  Alex [name] is a 51 y.o. Male with a history of kidney stones, and bone spurs, presents complaining of left wrist pain.  Patient reports 3 months ago he fell and caught himself on the left wrist, had pain but did not see a doctor at that time.  2 weeks ago the patient reports he bent down to pick up something heavy and was bracing himself against the left wrist and has had pain and mild swelling since then.  Patient denies any redness, warmth or erythema, no fevers or chills.  Patient reports pain is worse with movement, but he is able to move the wrist through full range of motion.  Denies any numbness or tingling. Has not seen anyone for this wrist pain.  Patient has not taken any over-the-counter pain medications for his pain, reports Tylenol and ibuprofen rarely help him.  Has not tried any braces ice or heat.      Past Medical History:  Diagnosis Date  . Bone spur   . Flu   . Kidney stone     Patient Active Problem List   Diagnosis Date Noted  . Neuropathy 04/11/2017  . Essential hypertension 04/11/2017  . Chronic pain of both knees 04/11/2017  . Right hand pain 04/11/2017  . Pain of right heel 04/11/2017    History reviewed. No pertinent surgical history.     Home Medications    Prior to Admission medications   Medication Sig Start Date End Date Taking? Authorizing Provider  amLODipine (NORVASC) 5 MG tablet Take 1 tablet (5 mg total) by mouth daily. 07/13/17   Bing NeighborsHarris, Kimberly S, FNP  fluticasone (FLONASE) 50 MCG/ACT nasal spray Place 1 spray daily into both nostrils. Patient not taking: Reported on 07/13/2017 06/14/17   Linus MakoBurky, Natalie B, NP  furosemide (LASIX) 20 MG tablet Take 2 tablets (40 mg total) by mouth daily as needed. Take in the morning.  07/13/17   Bing NeighborsHarris, Kimberly S, FNP  gabapentin (NEURONTIN) 300 MG capsule Take 1 capsule (300 mg total) by mouth 3 (three) times daily. Patient not taking: Reported on 07/13/2017 04/04/17   Bing NeighborsHarris, Kimberly S, FNP  guaiFENesin (MUCINEX) 600 MG 12 hr tablet Take 1 tablet (600 mg total) 2 (two) times daily by mouth. 06/14/17   Georgetta HaberBurky, Natalie B, NP  hydrochlorothiazide (HYDRODIURIL) 25 MG tablet Take 1 tablet (25 mg total) by mouth daily. 07/13/17   Bing NeighborsHarris, Kimberly S, FNP  ibuprofen (ADVIL,MOTRIN) 800 MG tablet Take 1 tablet (800 mg total) by mouth 3 (three) times daily. Patient not taking: Reported on 07/13/2017 07/02/17   Emi HolesLaw, Alexandra M, PA-C  naproxen (NAPROSYN) 500 MG tablet Take 1 tablet (500 mg total) by mouth 2 (two) times daily with a meal. 07/13/17   Bing NeighborsHarris, Kimberly S, FNP  potassium chloride SA (K-DUR,KLOR-CON) 20 MEQ tablet Take 1 tablet (20 mEq total) by mouth daily. 07/13/17   Bing NeighborsHarris, Kimberly S, FNP  tamsulosin (FLOMAX) 0.4 MG CAPS capsule Take 1 capsule (0.4 mg total) by mouth daily after supper. 07/02/17   Emi HolesLaw, Alexandra M, PA-C    Family History Family History  Problem Relation Age of Onset  . Hypertension Mother   . Kidney disease Mother   . Heart disease Mother   . Cancer Father  Social History Social History   Tobacco Use  . Smoking status: Current Every Day Smoker    Packs/day: 0.10    Types: Cigarettes    Last attempt to quit: 02/16/2017    Years since quitting: 0.4  . Smokeless tobacco: Never Used  Substance Use Topics  . Alcohol use: No    Comment: former quit January 2018  . Drug use: No    Comment: last use in April 2018     Allergies   Patient has no known allergies.   Review of Systems Review of Systems  Constitutional: Negative for chills and fever.  Musculoskeletal: Positive for arthralgias (L wrist pain) and joint swelling.  Skin: Negative for color change, rash and wound.  Neurological: Negative for weakness and numbness.     Physical  Exam Updated Vital Signs BP 135/80 (BP Location: Right Arm)   Pulse 80   Temp 98.1 F (36.7 C) (Oral)   Resp 16   Ht 6\' 3"  (1.905 m)   Wt 132 kg (291 lb)   SpO2 97%   BMI 36.37 kg/m   Physical Exam  Constitutional: He is oriented to person, place, and time. He appears well-developed and well-nourished. No distress.  HENT:  Head: Normocephalic and atraumatic.  Eyes: Right eye exhibits no discharge. Left eye exhibits no discharge.  Pulmonary/Chest: Effort normal. No respiratory distress.  Musculoskeletal:  Tenderness to palpation over the volar aspect of the left wrist, primarily at the snuffbox, no palpable deformity, minimal swelling, no erythema or warmth, full active and passive range of motion of the wrist with some discomfort, radial pulse 2+, good capillary refill, sensation intact throughout hand, normal grip strength Positive Finkelstein test  Neurological: He is alert and oriented to person, place, and time. Coordination normal.  Skin: Skin is warm and dry. Capillary refill takes less than 2 seconds. He is not diaphoretic.  Psychiatric: He has a normal mood and affect. His behavior is normal.  Nursing note and vitals reviewed.    ED Treatments / Results  Labs (all labs ordered are listed, but only abnormal results are displayed) Labs Reviewed - No data to display  EKG  EKG Interpretation None       Radiology Dg Wrist Complete Left  Result Date: 08/06/2017 CLINICAL DATA:  Remote injury 3 months ago while walking a pit bull. Patient fell onto outstretched hand. Wrist pain began 2 months ago. EXAM: LEFT WRIST - COMPLETE 3+ VIEW COMPARISON:  None. FINDINGS: There is no evidence of fracture or dislocation. Slight ulnar minus variance. There is no evidence of arthropathy or other focal bone abnormality. There is slight soft tissue swelling adjacent to the distal radius and ulna. IMPRESSION: Nonspecific mild soft tissue swelling about the distal radius and ulna. No acute  osseous appearing abnormality. Electronically Signed   By: Tollie Ethavid  Kwon M.D.   On: 08/06/2017 19:01    Procedures Procedures (including critical care time)  Medications Ordered in ED Medications  ibuprofen (ADVIL,MOTRIN) tablet 600 mg (600 mg Oral Given 08/06/17 2219)  traMADol (ULTRAM) tablet 50 mg (50 mg Oral Given 08/06/17 2220)     Initial Impression / Assessment and Plan / ED Course  I have reviewed the triage vital signs and the nursing notes.  Pertinent labs & imaging results that were available during my care of the patient were reviewed by me and considered in my medical decision making (see chart for details).  Patient presents with left wrist pain, primarily over the anatomical snuffbox.  Left  wrist and hand is neurovascularly intact, x-ray shows no evidence of fracture.  Positive Finkelstein's on exam.  Likely de Quervain's tenosynovitis or other soft tissue injury.  Pain treated here in the ED.  Patient will be placed in thumb spica splint patient to follow-up with hand surgery.  Patient discharged with NSAID therapy, as well as a small amount of tramadol for breakthrough pain.  Return precautions discussed.  Patient expressed understanding and is in agreement with plan.  Final Clinical Impressions(s) / ED Diagnoses   Final diagnoses:  Left wrist pain    ED Discharge Orders        Ordered    traMADol (ULTRAM) 50 MG tablet  Every 6 hours PRN     08/06/17 2220       Dartha Lodge, PA-C 08/07/17 0121    Pricilla Loveless, MD 08/10/17 1032

## 2017-08-06 NOTE — ED Notes (Signed)
Called pt 3 x no answer

## 2017-08-06 NOTE — Progress Notes (Signed)
Orthopedic Tech Progress Note Patient Details:  Alex [name] 10/22/1965 [id number]  Ortho Devices Type of Ortho Device: Thumb velcro splint Ortho Device/Splint Location: LUE Ortho Device/Splint Interventions: Ordered, Application   Post Interventions Patient Tolerated: Well Instructions Provided: Care of device   Jennye MoccasinHughes, Aminat [place]ne Craig 08/06/2017, 10:27 PM

## 2017-08-06 NOTE — Discharge Instructions (Signed)
Your x-ray today shows no evidence of fracture, does show some mild swelling.  Likely a muscle or tendon injury from your fall, may be de Quervain's tenosynovitis, please read the handout provided.  Continue taking NSAIDs as this will help with the inflammation even if it does not vastly improve your pain, you may take tramadol as needed for breakthrough pain.  Apply ice to the area and remain in resplinted until you are able to follow-up with the hand specialist at [facility].  If you have significantly worsening pain, redness, swelling or warmth to the joint or fevers and chills please return to the ED for sooner reevaluation

## 2017-08-07 HISTORY — PX: ELBOW SURGERY: SHX618

## 2017-08-15 ENCOUNTER — Ambulatory Visit (HOSPITAL_COMMUNITY)
Admission: EM | Admit: 2017-08-15 | Discharge: 2017-08-15 | Disposition: A | Discharge status: Home / Self Care | Payer: Self-pay | Attending: Family Medicine | Admitting: Family Medicine

## 2017-08-15 ENCOUNTER — Other Ambulatory Visit: Payer: Self-pay

## 2017-08-15 ENCOUNTER — Encounter (HOSPITAL_COMMUNITY): Payer: Self-pay | Admitting: Emergency Medicine

## 2017-08-15 DIAGNOSIS — K0889 Other specified disorders of teeth and supporting structures: Secondary | ICD-10-CM

## 2017-08-15 DIAGNOSIS — J0141 Acute recurrent pansinusitis: Secondary | ICD-10-CM

## 2017-08-15 MED ORDER — AMOXICILLIN-POT CLAVULANATE 875-125 MG PO TABS: 1.0000 | ORAL_TABLET | Freq: Two times a day (BID) | ORAL | 0 refills | Status: DC

## 2017-08-15 MED ORDER — KETOROLAC TROMETHAMINE 60 MG/2ML IM SOLN
INTRAMUSCULAR | Status: AC
  Filled 2017-08-15: qty 2

## 2017-08-15 MED ORDER — KETOROLAC TROMETHAMINE 60 MG/2ML IM SOLN
60.0000 mg | Freq: Once | INTRAMUSCULAR | Status: AC
  Administered 2017-08-15: 60 mg via INTRAMUSCULAR

## 2017-08-15 MED ORDER — DEXAMETHASONE SODIUM PHOSPHATE 10 MG/ML IJ SOLN
10.0000 mg | Freq: Once | INTRAMUSCULAR | Status: AC
  Administered 2017-08-15: 10 mg via INTRAMUSCULAR

## 2017-08-15 MED ORDER — DEXAMETHASONE SODIUM PHOSPHATE 10 MG/ML IJ SOLN
INTRAMUSCULAR | Status: AC
  Filled 2017-08-15: qty 1

## 2017-08-15 MED ORDER — CETIRIZINE HCL 10 MG PO TABS: 10.0000 mg | ORAL_TABLET | Freq: Every day | ORAL | 0 refills | Status: DC

## 2017-08-15 MED ORDER — IBUPROFEN 800 MG PO TABS: 800.0000 mg | ORAL_TABLET | Freq: Three times a day (TID) | ORAL | 0 refills | Status: DC

## 2017-08-15 MED ORDER — IPRATROPIUM BROMIDE 0.06 % NA SOLN: 2.0000 | Freq: Four times a day (QID) | NASAL | 0 refills | Status: DC

## 2017-08-15 MED ORDER — FLUTICASONE PROPIONATE 50 MCG/ACT NA SUSP: 1.0000 | Freq: Every day | NASAL | 0 refills | Status: DC

## 2017-08-15 NOTE — Discharge Instructions (Signed)
As discussed, 4-day history of sinus symptoms most likely due to viral illness.   continue Flonase, start Zyrtec and Atrovent nasal spray for symptoms. You can use over the counter nasal saline rinse such as neti pot for nasal congestion. Keep hydrated, your urine should be clear to pale yellow in color. Tylenol/motrin for fever and pain. Monitor for any worsening of symptoms, chest pain, shortness of breath, wheezing, swelling of the throat, follow up for reevaluation.   Given dental pain, will cover possible dental infection with Augmentin.  This can also help with your sinus infection if it starts to turn bacterial. Follow up with dentist for further care of dental problems.

## 2017-08-15 NOTE — ED Provider Notes (Signed)
[facility]    CSN: [id number] Arrival date & time: 08/15/17  1715     History   Chief Complaint Chief Complaint  Patient presents with  . URI    HPI Alex [name] is a 52 y.o. male.   52 year old male with history of HTN comes in for 4-day history of sinus symptoms.  He has had headache, pressure behind the left eye, dental pain, left eye soreness.  He has had subjective fever without chills, night sweats.  He states sinus symptoms have been recurrent, alternates between the right side of the face, left side of the face.  He has also had dental pain with comes swelling.  He has been taking Flonase and OTC decongestants without relief.       Past Medical History:  Diagnosis Date  . Bone spur   . Flu   . Kidney stone     Patient Active Problem List   Diagnosis Date Noted  . Neuropathy 04/11/2017  . Essential hypertension 04/11/2017  . Chronic pain of both knees 04/11/2017  . Right hand pain 04/11/2017  . Pain of right heel 04/11/2017    History reviewed. No pertinent surgical history.     Home Medications    Prior to Admission medications   Medication Sig Start Date End Date Taking? Authorizing Provider  amLODipine (NORVASC) 5 MG tablet Take 1 tablet (5 mg total) by mouth daily. 07/13/17   Bing NeighborsHarris, Kimberly S, FNP  amoxicillin-clavulanate (AUGMENTIN) 875-125 MG tablet Take 1 tablet by mouth every 12 (twelve) hours. 08/15/17   Cathie HoopsYu, Jeyren Danowski V, PA-C  cetirizine (ZYRTEC) 10 MG tablet Take 1 tablet (10 mg total) by mouth daily. 08/15/17   Cathie HoopsYu, Quincee Gittens V, PA-C  fluticasone (FLONASE) 50 MCG/ACT nasal spray Place 1 spray into both nostrils daily. 08/15/17   Cathie HoopsYu, Marilyn Nihiser V, PA-C  furosemide (LASIX) 20 MG tablet Take 2 tablets (40 mg total) by mouth daily as needed. Take in the morning. 07/13/17   Bing NeighborsHarris, Kimberly S, FNP  gabapentin (NEURONTIN) 300 MG capsule Take 1 capsule (300 mg total) by mouth 3 (three) times daily. Patient not taking: Reported on 07/13/2017 04/04/17   Bing NeighborsHarris,  Kimberly S, FNP  guaiFENesin (MUCINEX) 600 MG 12 hr tablet Take 1 tablet (600 mg total) 2 (two) times daily by mouth. 06/14/17   Georgetta HaberBurky, Natalie B, NP  hydrochlorothiazide (HYDRODIURIL) 25 MG tablet Take 1 tablet (25 mg total) by mouth daily. 07/13/17   Bing NeighborsHarris, Kimberly S, FNP  ibuprofen (ADVIL,MOTRIN) 800 MG tablet Take 1 tablet (800 mg total) by mouth 3 (three) times daily. 08/15/17   Cathie HoopsYu, Wilfredo Canterbury V, PA-C  ipratropium (ATROVENT) 0.06 % nasal spray Place 2 sprays into both nostrils 4 (four) times daily. 08/15/17   Cathie HoopsYu, Tatyana Biber V, PA-C  naproxen (NAPROSYN) 500 MG tablet Take 1 tablet (500 mg total) by mouth 2 (two) times daily with a meal. 07/13/17   Bing NeighborsHarris, Kimberly S, FNP  potassium chloride SA (K-DUR,KLOR-CON) 20 MEQ tablet Take 1 tablet (20 mEq total) by mouth daily. 07/13/17   Bing NeighborsHarris, Kimberly S, FNP  tamsulosin (FLOMAX) 0.4 MG CAPS capsule Take 1 capsule (0.4 mg total) by mouth daily after supper. 07/02/17   Law, [name], PA-C  traMADol (ULTRAM) 50 MG tablet Take 1 tablet (50 mg total) by mouth every 6 (six) hours as needed. 08/06/17   Dartha LodgeFord, Kelsey N, PA-C    Family History Family History  Problem Relation Age of Onset  . Hypertension Mother   . Kidney disease  Mother   . Heart disease Mother   . Cancer Father     Social History Social History   Tobacco Use  . Smoking status: Current Every Day Smoker    Packs/day: 0.10    Types: Cigarettes    Last attempt to quit: 02/16/2017    Years since quitting: 0.4  . Smokeless tobacco: Never Used  Substance Use Topics  . Alcohol use: No    Comment: former quit January 2018  . Drug use: No    Comment: last use in April 2018     Allergies   Patient has no known allergies.   Review of Systems Review of Systems  Reason unable to perform ROS: See HPI as above.     Physical Exam Triage Vital Signs ED Triage Vitals  Enc Vitals Group     BP 08/15/17 1842 (!) 128/50     Pulse Rate 08/15/17 1842 70     Resp 08/15/17 1842 20     Temp 08/15/17  1842 98.4 F (36.9 C)     Temp Source 08/15/17 1842 Oral     SpO2 08/15/17 1842 97 %     Weight --      Height --      Head Circumference --      Peak Flow --      Pain Score 08/15/17 1840 10     Pain Loc --      Pain Edu? --      Excl. in GC? --    No data found.  Updated Vital Signs BP (!) 128/50 (BP Location: Right Arm) Comment (BP Location): large cuff  Pulse 70   Temp 98.4 F (36.9 C) (Oral)   Resp 20   SpO2 97%   Physical Exam  Constitutional: He is oriented to person, place, and time. He appears well-developed and well-nourished. No distress.  HENT:  Head: Normocephalic and atraumatic.  Right Ear: Tympanic membrane, external ear and ear canal normal. Tympanic membrane is not erythematous and not bulging.  Left Ear: External ear and ear canal normal. Tympanic membrane is erythematous. Tympanic membrane is not bulging.  Nose: Mucosal edema and rhinorrhea present. Right sinus exhibits no maxillary sinus tenderness and no frontal sinus tenderness. Left sinus exhibits maxillary sinus tenderness and frontal sinus tenderness.  Mouth/Throat: Uvula is midline, oropharynx is clear and moist and mucous membranes are normal.  Overall poor dentition, multiple teeth missing on the left upper molars.  Tenderness on palpation along the gums of left upper region.  No obvious mass, fluctuance felt.  Floor of mouth soft.  No facial swelling.  Eyes: EOM and lids are normal. Pupils are equal, round, and reactive to light. Right eye exhibits no discharge. Left eye exhibits no discharge. Right conjunctiva is injected. Left conjunctiva is injected.  Neck: Normal range of motion. Neck supple.  Cardiovascular: Normal rate, regular rhythm and normal heart sounds. Exam reveals no gallop and no friction rub.  No murmur heard. Pulmonary/Chest: Effort normal and breath sounds normal. He has no decreased breath sounds. He has no wheezes. He has no rhonchi. He has no rales.  Lymphadenopathy:    He has  no cervical adenopathy.  Neurological: He is alert and oriented to person, place, and time.  Skin: Skin is warm and dry.  Psychiatric: He has a normal mood and affect. His behavior is normal. Judgment normal.     UC Treatments / Results  Labs (all labs ordered are listed, but only abnormal results are  displayed) Labs Reviewed - No data to display  EKG  EKG Interpretation None       Radiology No results found.  Procedures Procedures (including critical care time)  Medications Ordered in UC Medications  ketorolac (TORADOL) injection 60 mg (60 mg Intramuscular Given 08/15/17 1936)  dexamethasone (DECADRON) injection 10 mg (10 mg Intramuscular Given 08/15/17 1936)     Initial Impression / Assessment and Plan / UC Course  I have reviewed the triage vital signs and the nursing notes.  Pertinent labs & imaging results that were available during my care of the patient were reviewed by me and considered in my medical decision making (see chart for details).    Discussed with patient 4-day history of sinus symptoms most likely viral.  However, given dental pain with possible dental infection, will treat with Augmentin.  Other symptomatic treatment discussed.  Patient to follow-up with dentist for further  evaluation of dental problems.  Return precautions given.  Patient expresses understanding and agrees to plan.  Final Clinical Impressions(s) / UC Diagnoses   Final diagnoses:  Acute recurrent pansinusitis  Pain, dental    ED Discharge Orders        Ordered    fluticasone (FLONASE) 50 MCG/ACT nasal spray  Daily     08/15/17 1912    ibuprofen (ADVIL,MOTRIN) 800 MG tablet  3 times daily     08/15/17 1912    cetirizine (ZYRTEC) 10 MG tablet  Daily     08/15/17 1912    ipratropium (ATROVENT) 0.06 % nasal spray  4 times daily     08/15/17 1912    amoxicillin-clavulanate (AUGMENTIN) 875-125 MG tablet  Every 12 hours     08/15/17 1913        Belinda Fisher, PA-C 08/15/17  1952

## 2017-08-15 NOTE — ED Triage Notes (Signed)
Onset 4 days ago of sinus complaints.  Complains of headache, pressure behind left eye, teeth hurt, left ear soreness.

## 2017-08-30 ENCOUNTER — Encounter: Payer: Self-pay | Admitting: Urology

## 2017-08-30 ENCOUNTER — Ambulatory Visit: Payer: Self-pay | Admitting: Urology

## 2017-10-12 ENCOUNTER — Ambulatory Visit: Payer: Self-pay | Admitting: Family Medicine

## 2018-01-16 ENCOUNTER — Ambulatory Visit (INDEPENDENT_AMBULATORY_CARE_PROVIDER_SITE_OTHER): Payer: Worker's Compensation

## 2018-01-16 ENCOUNTER — Encounter (HOSPITAL_COMMUNITY): Payer: Self-pay | Admitting: Emergency Medicine

## 2018-01-16 ENCOUNTER — Ambulatory Visit (HOSPITAL_COMMUNITY)
Admission: EM | Admit: 2018-01-16 | Discharge: 2018-01-16 | Disposition: A | Discharge status: Home / Self Care | Payer: Worker's Compensation | Attending: Family Medicine | Admitting: Family Medicine

## 2018-01-16 DIAGNOSIS — M25521 Pain in right elbow: Secondary | ICD-10-CM

## 2018-01-16 MED ORDER — DICLOFENAC SODIUM 75 MG PO TBEC: 75.0000 mg | DELAYED_RELEASE_TABLET | Freq: Two times a day (BID) | ORAL | 0 refills | Status: DC

## 2018-01-16 NOTE — ED Provider Notes (Signed)
[facility]   [id number] 01/16/18 Arrival Time: 1621  ASSESSMENT & PLAN:  1. Right elbow pain    Imaging: Dg Elbow Complete Right (3+view)  Result Date: 01/16/2018 CLINICAL DATA:  Pain. EXAM: RIGHT ELBOW - COMPLETE 3+ VIEW COMPARISON:  None. FINDINGS: No joint effusion. Enthesopathic changes at the proximal ulna. Degenerative changes in the elbow. No acute fracture. IMPRESSION: Degenerative changes.  No fracture or effusion. Electronically Signed   By: Gerome Sam III M.D   On: 01/16/2018 17:16   Meds ordered this encounter  Medications  . diclofenac (VOLTAREN) 75 MG EC tablet    Sig: Take 1 tablet (75 mg total) by mouth 2 (two) times daily.    Dispense:  14 tablet    Refill:  0   Follow-up Information    Schedule an appointment as soon as possible for a visit  with Haddix, Gillie Manners, MD.   Specialty:  Orthopedic Surgery Why:  If not improving over the next several days. Contact information: [address] [place] [address] [phone]          Work note given. Ice to elbow.  Reviewed expectations re: course of current medical issues. Questions answered. Outlined signs and symptoms indicating need for more acute intervention. Patient verbalized understanding. After Visit Summary given.  SUBJECTIVE: History from: patient. Alex [name] is a 52 y.o. male who reports persistent mild to moderate pain of his right elbow that is stable; described as aching soreness without radiation. Onset: abrupt, today. Injury/trama: yes, was swinging 20lb maul hammer; 'kind of felt a pop in my elbow' followed by pain. Relieved by: keeping still. Worsened by: movements. Associated symptoms: none reported. Extremity sensation changes or weakness: none. Self treatment: has not tried OTCs for relief of pain. History of similar: no  ROS: As per HPI.   OBJECTIVE:  Vitals:   01/16/18 1644  BP: (!) 150/70  Pulse: 66  Resp: 18  Temp: 98.5 F (36.9 C)    TempSrc: Oral  SpO2: 99%    General appearance: alert; no distress Extremities: warm and well perfused; symmetrical with no gross deformities; diffuse tenderness over his right lateral elbow with no swelling and no bruising; ROM: limited by pain CV: normal extremity capillary refill Skin: warm and dry Neurologic: normal gait; normal symmetric reflexes in all extremities; normal sensation in all extremities Psychological: alert and cooperative; normal mood and affect  No Known Allergies  Past Medical History:  Diagnosis Date  . Bone spur   . Flu   . Kidney stone    Social History   Socioeconomic History  . Marital status: Married    Spouse name: Not on file  . Number of children: Not on file  . Years of education: Not on file  . Highest education level: Not on file  Occupational History  . Not on file  Social Needs  . Financial resource strain: Not on file  . Food insecurity:    Worry: Not on file    Inability: Not on file  . Transportation needs:    Medical: Not on file    Non-medical: Not on file  Tobacco Use  . Smoking status: Current Every Day Smoker    Packs/day: 0.10    Types: Cigarettes    Last attempt to quit: 02/16/2017    Years since quitting: 0.9  . Smokeless tobacco: Never Used  Substance and Sexual Activity  . Alcohol use: No    Comment: former quit January 2018  .  Drug use: No    Types: Cocaine    Comment: last use in April 2018  . Sexual activity: Not on file  Lifestyle  . Physical activity:    Days per week: Not on file    Minutes per session: Not on file  . Stress: Not on file  Relationships  . Social connections:    Talks on phone: Not on file    Gets together: Not on file    Attends religious service: Not on file    Active member of club or organization: Not on file    Attends meetings of clubs or organizations: Not on file    Relationship status: Not on file  . Intimate partner violence:    Fear of current or ex partner: Not on file     Emotionally abused: Not on file    Physically abused: Not on file    Forced sexual activity: Not on file  Other Topics Concern  . Not on file  Social History Narrative  . Not on file   Family History  Problem Relation Age of Onset  . Hypertension Mother   . Kidney disease Mother   . Heart disease Mother   . Cancer Father    History reviewed. No pertinent surgical history.    Mardella LaymanHagler, Aleathia Purdy, MD 01/16/18 1736

## 2018-01-16 NOTE — ED Triage Notes (Signed)
Pt here for right elbow pain after swinging heavy hammer today

## 2018-06-03 ENCOUNTER — Other Ambulatory Visit: Payer: Self-pay

## 2018-06-03 ENCOUNTER — Ambulatory Visit (HOSPITAL_COMMUNITY)
Admission: EM | Admit: 2018-06-03 | Discharge: 2018-06-03 | Disposition: A | Discharge status: Home / Self Care | Payer: Self-pay | Attending: Family Medicine | Admitting: Family Medicine

## 2018-06-03 ENCOUNTER — Encounter (HOSPITAL_COMMUNITY): Payer: Self-pay

## 2018-06-03 DIAGNOSIS — J0101 Acute recurrent maxillary sinusitis: Secondary | ICD-10-CM

## 2018-06-03 MED ORDER — AMOXICILLIN-POT CLAVULANATE 875-125 MG PO TABS: 1.0000 | ORAL_TABLET | Freq: Two times a day (BID) | ORAL | 0 refills | Status: DC

## 2018-06-03 NOTE — ED Provider Notes (Signed)
[facility]    CSN: [id number] Arrival date & time: 06/03/18  1344     History   Chief Complaint Chief Complaint  Patient presents with  . Facial Pain    HPI Alex [name] is a 52 y.o. male.   Established Pt c/o sinus pressure and pain. X 4 days.  Patient is working light duty owing to recent surgery on his right elbow.  He does demolition work.  Patient has a long history of recurrent sinus infection.  The last 4 days he has had throbbing pain in the right cheek and retro-orbital area associated with redness in the right eye, no fever, and unresponsive to fluticasone and warm compresses.     Past Medical History:  Diagnosis Date  . Bone spur   . Flu   . Kidney stone     Patient Active Problem List   Diagnosis Date Noted  . Neuropathy 04/11/2017  . Essential hypertension 04/11/2017  . Chronic pain of both knees 04/11/2017  . Right hand pain 04/11/2017  . Pain of right heel 04/11/2017    History reviewed. No pertinent surgical history.     Home Medications    Prior to Admission medications   Medication Sig Start Date End Date Taking? Authorizing Provider  amLODipine (NORVASC) 5 MG tablet Take 1 tablet (5 mg total) by mouth daily. 07/13/17   Bing Neighbors, FNP  amoxicillin-clavulanate (AUGMENTIN) 875-125 MG tablet Take 1 tablet by mouth every 12 (twelve) hours. 08/15/17   Cathie Hoops, Amy V, PA-C  cetirizine (ZYRTEC) 10 MG tablet Take 1 tablet (10 mg total) by mouth daily. 08/15/17   Cathie Hoops, Amy V, PA-C  diclofenac (VOLTAREN) 75 MG EC tablet Take 1 tablet (75 mg total) by mouth 2 (two) times daily. 01/16/18   Mardella Layman, MD  fluticasone (FLONASE) 50 MCG/ACT nasal spray Place 1 spray into both nostrils daily. 08/15/17   Cathie Hoops, Amy V, PA-C  furosemide (LASIX) 20 MG tablet Take 2 tablets (40 mg total) by mouth daily as needed. Take in the morning. 07/13/17   Bing Neighbors, FNP  gabapentin (NEURONTIN) 300 MG capsule Take 1 capsule (300 mg total) by mouth 3 (three)  times daily. Patient not taking: Reported on 07/13/2017 04/04/17   Bing Neighbors, FNP  guaiFENesin (MUCINEX) 600 MG 12 hr tablet Take 1 tablet (600 mg total) 2 (two) times daily by mouth. 06/14/17   Georgetta Haber, NP  hydrochlorothiazide (HYDRODIURIL) 25 MG tablet Take 1 tablet (25 mg total) by mouth daily. 07/13/17   Bing Neighbors, FNP  ibuprofen (ADVIL,MOTRIN) 800 MG tablet Take 1 tablet (800 mg total) by mouth 3 (three) times daily. 08/15/17   Cathie Hoops, Amy V, PA-C  ipratropium (ATROVENT) 0.06 % nasal spray Place 2 sprays into both nostrils 4 (four) times daily. 08/15/17   Cathie Hoops, Amy V, PA-C  naproxen (NAPROSYN) 500 MG tablet Take 1 tablet (500 mg total) by mouth 2 (two) times daily with a meal. 07/13/17   Bing Neighbors, FNP  potassium chloride SA (K-DUR,KLOR-CON) 20 MEQ tablet Take 1 tablet (20 mEq total) by mouth daily. 07/13/17   Bing Neighbors, FNP  tamsulosin (FLOMAX) 0.4 MG CAPS capsule Take 1 capsule (0.4 mg total) by mouth daily after supper. 07/02/17   Law, Waylan Boga, PA-C  traMADol (ULTRAM) 50 MG tablet Take 1 tablet (50 mg total) by mouth every 6 (six) hours as needed. 08/06/17   Dartha Lodge, PA-C    Family History Family History  Problem Relation Age of Onset  . Hypertension Mother   . Kidney disease Mother   . Heart disease Mother   . Cancer Father     Social History Social History   Tobacco Use  . Smoking status: Current Every Day Smoker    Packs/day: 0.10    Types: Cigarettes    Last attempt to quit: 02/16/2017    Years since quitting: 1.2  . Smokeless tobacco: Never Used  Substance Use Topics  . Alcohol use: No    Comment: former quit January 2018  . Drug use: No    Types: Cocaine    Comment: last use in April 2018     Allergies   Patient has no known allergies.   Review of Systems Review of Systems  Constitutional: Negative.   HENT: Positive for sinus pressure and sinus pain.   Eyes: Positive for pain and redness.  All other systems reviewed  and are negative.    Physical Exam Triage Vital Signs ED Triage Vitals  Enc Vitals Group     BP 06/03/18 1352 (!) 145/69     Pulse Rate 06/03/18 1352 83     Resp 06/03/18 1352 18     Temp 06/03/18 1352 97.8 F (36.6 C)     Temp src --      SpO2 06/03/18 1352 98 %     Weight 06/03/18 1353 (!) 309 lb (140.2 kg)     Height --      Head Circumference --      Peak Flow --      Pain Score 06/03/18 1353 7     Pain Loc --      Pain Edu? --      Excl. in GC? --    No data found.  Updated Vital Signs BP (!) 145/69 (BP Location: Right Arm)   Pulse 83   Temp 97.8 F (36.6 C)   Resp 18   Wt (!) 140.2 kg   SpO2 98%   BMI 38.62 kg/m   Physical Exam  Constitutional: He is oriented to person, place, and time. He appears well-developed and well-nourished.  HENT:  Right Ear: External ear normal.  Left Ear: External ear normal.  Eyes: Conjunctivae are normal.  Neck: Normal range of motion. Neck supple.  Pulmonary/Chest: Effort normal.  Musculoskeletal: Normal range of motion.  Neurological: He is alert and oriented to person, place, and time.    Skin: Skin is warm and dry.  Nursing note and vitals reviewed.    UC Treatments / Results  Labs (all labs ordered are listed, but only abnormal results are displayed) Labs Reviewed - No data to display  EKG None  Radiology No results found.  Procedures Procedures (including critical care time)  Medications Ordered in UC Medications - No data to display  Initial Impression / Assessment and Plan / UC Course  I have reviewed the triage vital signs and the nursing notes.  Pertinent labs & imaging results that were available during my care of the patient were reviewed by me and considered in my medical decision making (see chart for details).    Final Clinical Impressions(s) / UC Diagnoses   Final diagnoses:  None   Discharge Instructions   None    ED Prescriptions    None     Controlled Substance  Prescriptions [place] Controlled Substance Registry consulted? Not Applicable   Elvina Sidle, MD 06/03/18 1418

## 2018-06-03 NOTE — ED Triage Notes (Signed)
Pt c/o sinus pressure and pain. X 4 days

## 2018-06-23 ENCOUNTER — Emergency Department (HOSPITAL_COMMUNITY): Payer: Self-pay

## 2018-06-23 ENCOUNTER — Emergency Department (HOSPITAL_COMMUNITY)
Admission: EM | Admit: 2018-06-23 | Discharge: 2018-06-23 | Disposition: A | Discharge status: Home / Self Care | Payer: Self-pay | Attending: Emergency Medicine | Admitting: Emergency Medicine

## 2018-06-23 ENCOUNTER — Encounter (HOSPITAL_COMMUNITY): Payer: Self-pay | Admitting: Emergency Medicine

## 2018-06-23 ENCOUNTER — Other Ambulatory Visit: Payer: Self-pay

## 2018-06-23 DIAGNOSIS — Z79899 Other long term (current) drug therapy: Secondary | ICD-10-CM | POA: Insufficient documentation

## 2018-06-23 DIAGNOSIS — F17210 Nicotine dependence, cigarettes, uncomplicated: Secondary | ICD-10-CM | POA: Insufficient documentation

## 2018-06-23 DIAGNOSIS — N23 Unspecified renal colic: Secondary | ICD-10-CM | POA: Insufficient documentation

## 2018-06-23 DIAGNOSIS — I10 Essential (primary) hypertension: Secondary | ICD-10-CM | POA: Insufficient documentation

## 2018-06-23 LAB — CBC WITH DIFFERENTIAL/PLATELET
ABS IMMATURE GRANULOCYTES: 0.02 10*3/uL (ref 0.00–0.07)
BASOS ABS: 0.1 10*3/uL (ref 0.0–0.1)
BASOS PCT: 1 %
EOS PCT: 3 %
Eosinophils Absolute: 0.3 10*3/uL (ref 0.0–0.5)
HCT: 47.2 % (ref 39.0–52.0)
Hemoglobin: 14.8 g/dL (ref 13.0–17.0)
IMMATURE GRANULOCYTES: 0 %
LYMPHS ABS: 3.5 10*3/uL (ref 0.7–4.0)
Lymphocytes Relative: 46 %
MCH: 28.4 pg (ref 26.0–34.0)
MCHC: 31.4 g/dL (ref 30.0–36.0)
MCV: 90.6 fL (ref 80.0–100.0)
MONO ABS: 0.9 10*3/uL (ref 0.1–1.0)
Monocytes Relative: 12 %
NRBC: 0 % (ref 0.0–0.2)
Neutro Abs: 2.8 10*3/uL (ref 1.7–7.7)
Neutrophils Relative %: 38 %
Platelets: 277 10*3/uL (ref 150–400)
RBC: 5.21 MIL/uL (ref 4.22–5.81)
RDW: 13.5 % (ref 11.5–15.5)
WBC: 7.5 10*3/uL (ref 4.0–10.5)

## 2018-06-23 LAB — BASIC METABOLIC PANEL
ANION GAP: 9 (ref 5–15)
BUN: 8 mg/dL (ref 6–20)
CO2: 22 mmol/L (ref 22–32)
Calcium: 9 mg/dL (ref 8.9–10.3)
Chloride: 108 mmol/L (ref 98–111)
Creatinine, Ser: 1.16 mg/dL (ref 0.61–1.24)
GFR calc non Af Amer: >60 mL/min (ref >60)
Glucose, Bld: 123 mg/dL — ABNORMAL HIGH (ref 70–99)
POTASSIUM: 4 mmol/L (ref 3.5–5.1)
SODIUM: 139 mmol/L (ref 135–145)

## 2018-06-23 LAB — URINALYSIS, ROUTINE W REFLEX MICROSCOPIC
BILIRUBIN URINE: NEGATIVE
GLUCOSE, UA: NEGATIVE mg/dL
KETONES UR: NEGATIVE mg/dL
NITRITE: NEGATIVE
Protein, ur: NEGATIVE mg/dL
SPECIFIC GRAVITY, URINE: 1.024 (ref 1.005–1.030)
pH: 5 (ref 5.0–8.0)

## 2018-06-23 MED ORDER — HYDROMORPHONE HCL 1 MG/ML IJ SOLN
1.0000 mg | Freq: Once | INTRAMUSCULAR | Status: AC
  Administered 2018-06-23: 1 mg via INTRAVENOUS
  Filled 2018-06-23: qty 1

## 2018-06-23 MED ORDER — CEPHALEXIN 500 MG PO CAPS: 500.0000 mg | ORAL_CAPSULE | Freq: Four times a day (QID) | ORAL | 0 refills | Status: DC

## 2018-06-23 MED ORDER — ONDANSETRON 4 MG PO TBDP: 4.0000 mg | ORAL_TABLET | Freq: Three times a day (TID) | ORAL | 0 refills | Status: DC | PRN

## 2018-06-23 MED ORDER — OXYCODONE-ACETAMINOPHEN 5-325 MG PO TABS
2.0000 | ORAL_TABLET | Freq: Once | ORAL | Status: AC
  Administered 2018-06-23: 2 via ORAL
  Filled 2018-06-23: qty 2

## 2018-06-23 MED ORDER — KETOROLAC TROMETHAMINE 30 MG/ML IJ SOLN
30.0000 mg | Freq: Once | INTRAMUSCULAR | Status: AC
  Administered 2018-06-23: 30 mg via INTRAVENOUS
  Filled 2018-06-23: qty 1

## 2018-06-23 MED ORDER — TAMSULOSIN HCL 0.4 MG PO CAPS: 0.4000 mg | ORAL_CAPSULE | Freq: Every day | ORAL | 0 refills | Status: DC

## 2018-06-23 MED ORDER — ONDANSETRON HCL 4 MG/2ML IJ SOLN
4.0000 mg | Freq: Once | INTRAMUSCULAR | Status: AC
Start: 2018-06-23 — End: 2018-06-23
  Administered 2018-06-23: 4 mg via INTRAVENOUS
  Filled 2018-06-23: qty 2

## 2018-06-23 MED ORDER — IBUPROFEN 600 MG PO TABS: 600.0000 mg | ORAL_TABLET | Freq: Four times a day (QID) | ORAL | 0 refills | Status: DC | PRN

## 2018-06-23 MED ORDER — HYDROCODONE-ACETAMINOPHEN 5-325 MG PO TABS: 1.0000 | ORAL_TABLET | ORAL | 0 refills | Status: DC | PRN

## 2018-06-23 MED ORDER — SODIUM CHLORIDE 0.9 % IV BOLUS
1000.0000 mL | Freq: Once | INTRAVENOUS | Status: AC
  Administered 2018-06-23: 1000 mL via INTRAVENOUS

## 2018-06-23 NOTE — ED Triage Notes (Signed)
Patient here with history of kidney stones.  He states that the pain starts in his back on the left and radiating to the left flank.  Patient states that it started with pain ago.  No vomiting but he is nauseated.

## 2018-06-23 NOTE — ED Provider Notes (Signed)
[facility] Provider Note   CSN: [id number] Arrival date & time: 06/23/18  0421     History   Chief Complaint Chief Complaint  Patient presents with  . Nephrolithiasis    HPI Alex [name] is a 52 y.o. male.  Patient presents with left-sided flank pain that woke him from sleep about 45 minutes ago.  He believes this is a kidney stone.  He had similar pain on the right side due to kidney stone in the past but not required surgery.  Pain radiates to his left lower abdomen.  He did not take any for it at home.  Has had nausea but no vomiting.  No fever.  No pain with urination or blood in the urine.  No chest pain or shortness of breath.  No testicular pain. Denies any radiation of the pain down his legs.  No bowel or bladder incontinence.  The history is provided by the patient.    Past Medical History:  Diagnosis Date  . Bone spur   . Flu   . Kidney stone     Patient Active Problem List   Diagnosis Date Noted  . Neuropathy 04/11/2017  . Essential hypertension 04/11/2017  . Chronic pain of both knees 04/11/2017  . Right hand pain 04/11/2017  . Pain of right heel 04/11/2017    History reviewed. No pertinent surgical history.      Home Medications    Prior to Admission medications   Medication Sig Start Date End Date Taking? Authorizing Provider  amLODipine (NORVASC) 5 MG tablet Take 1 tablet (5 mg total) by mouth daily. 07/13/17   Bing NeighborsHarris, Kimberly S, FNP  amoxicillin-clavulanate (AUGMENTIN) 875-125 MG tablet Take 1 tablet by mouth every 12 (twelve) hours. 06/03/18   Elvina SidleLauenstein, Kurt, MD  cetirizine (ZYRTEC) 10 MG tablet Take 1 tablet (10 mg total) by mouth daily. 08/15/17   Cathie HoopsYu, Amy V, PA-C  diclofenac (VOLTAREN) 75 MG EC tablet Take 1 tablet (75 mg total) by mouth 2 (two) times daily. 01/16/18   Mardella LaymanHagler, Brian, MD  fluticasone (FLONASE) 50 MCG/ACT nasal spray Place 1 spray into both nostrils daily. 08/15/17   Cathie HoopsYu, Amy V, PA-C    furosemide (LASIX) 20 MG tablet Take 2 tablets (40 mg total) by mouth daily as needed. Take in the morning. 07/13/17   Bing NeighborsHarris, Kimberly S, FNP  gabapentin (NEURONTIN) 300 MG capsule Take 1 capsule (300 mg total) by mouth 3 (three) times daily. Patient not taking: Reported on 07/13/2017 04/04/17   Bing NeighborsHarris, Kimberly S, FNP  guaiFENesin (MUCINEX) 600 MG 12 hr tablet Take 1 tablet (600 mg total) 2 (two) times daily by mouth. 06/14/17   Georgetta HaberBurky, Natalie B, NP  hydrochlorothiazide (HYDRODIURIL) 25 MG tablet Take 1 tablet (25 mg total) by mouth daily. 07/13/17   Bing NeighborsHarris, Kimberly S, FNP  ibuprofen (ADVIL,MOTRIN) 800 MG tablet Take 1 tablet (800 mg total) by mouth 3 (three) times daily. 08/15/17   Cathie HoopsYu, Amy V, PA-C  ipratropium (ATROVENT) 0.06 % nasal spray Place 2 sprays into both nostrils 4 (four) times daily. 08/15/17   Cathie HoopsYu, Amy V, PA-C  naproxen (NAPROSYN) 500 MG tablet Take 1 tablet (500 mg total) by mouth 2 (two) times daily with a meal. 07/13/17   Bing NeighborsHarris, Kimberly S, FNP  potassium chloride SA (K-DUR,KLOR-CON) 20 MEQ tablet Take 1 tablet (20 mEq total) by mouth daily. 07/13/17   Bing NeighborsHarris, Kimberly S, FNP  tamsulosin (FLOMAX) 0.4 MG CAPS capsule Take 1 capsule (0.4 mg total)  by mouth daily after supper. 07/02/17   Law, Waylan Boga, PA-C  traMADol (ULTRAM) 50 MG tablet Take 1 tablet (50 mg total) by mouth every 6 (six) hours as needed. 08/06/17   Dartha Lodge, PA-C    Family History Family History  Problem Relation Age of Onset  . Hypertension Mother   . Kidney disease Mother   . Heart disease Mother   . Cancer Father     Social History Social History   Tobacco Use  . Smoking status: Current Every Day Smoker    Packs/day: 0.10    Types: Cigarettes    Last attempt to quit: 02/16/2017    Years since quitting: 1.3  . Smokeless tobacco: Never Used  Substance Use Topics  . Alcohol use: No    Comment: former quit January 2018  . Drug use: No    Types: Cocaine    Comment: last use in April 2018      Allergies   Patient has no known allergies.   Review of Systems Review of Systems  Constitutional: Negative for activity change, appetite change and fever.  HENT: Negative for congestion.   Respiratory: Negative for cough, chest tightness and shortness of breath.   Cardiovascular: Negative for chest pain.  Gastrointestinal: Positive for abdominal pain and nausea. Negative for vomiting.  Genitourinary: Negative for difficulty urinating, dysuria, hematuria and testicular pain.  Musculoskeletal: Positive for back pain. Negative for arthralgias and myalgias.  Skin: Negative for wound.  Neurological: Negative for dizziness, weakness, light-headedness and headaches.    all other systems are negative except as noted in the HPI and PMH.    Physical Exam Updated Vital Signs BP (!) 153/82 (BP Location: Right Arm)   Pulse 79   Temp 98.4 F (36.9 C) (Oral)   Resp 18   SpO2 97%   Physical Exam  Constitutional: He is oriented to person, place, and time. He appears well-developed and well-nourished. No distress.  HENT:  Head: Normocephalic and atraumatic.  Mouth/Throat: Oropharynx is clear and moist. No oropharyngeal exudate.  Eyes: Pupils are equal, round, and reactive to light. Conjunctivae and EOM are normal.  Neck: Normal range of motion. Neck supple.  No meningismus.  Cardiovascular: Normal rate, regular rhythm, normal heart sounds and intact distal pulses.  No murmur heard. Pulmonary/Chest: Effort normal and breath sounds normal. No respiratory distress.  Abdominal: Soft. There is no tenderness. There is no rebound and no guarding.  Abdomen soft, no guarding or rebound  Genitourinary:  Genitourinary Comments: No testicular tenderness  Musculoskeletal: Normal range of motion. He exhibits tenderness. He exhibits no edema.  Left CVA tenderness  Neurological: He is alert and oriented to person, place, and time. No cranial nerve deficit. He exhibits normal muscle tone.  Coordination normal.  No ataxia on finger to nose bilaterally. No pronator drift. 5/5 strength throughout. CN 2-12 intact.Equal grip strength. Sensation intact.   Skin: Skin is warm.  Psychiatric: He has a normal mood and affect. His behavior is normal.  Nursing note and vitals reviewed.    ED Treatments / Results  Labs (all labs ordered are listed, but only abnormal results are displayed) Labs Reviewed  BASIC METABOLIC PANEL - Abnormal; Notable for the following components:      Result Value   Glucose, Bld 123 (*)    All other components within normal limits  URINALYSIS, ROUTINE W REFLEX MICROSCOPIC - Abnormal; Notable for the following components:   APPearance HAZY (*)    Hgb urine dipstick MODERATE (*)  Leukocytes, UA SMALL (*)    RBC / HPF >50 (*)    Bacteria, UA RARE (*)    All other components within normal limits  URINE CULTURE  CBC WITH DIFFERENTIAL/PLATELET    EKG None  Radiology Ct Renal Stone Study  Result Date: 06/23/2018 CLINICAL DATA:  Left flank pain EXAM: CT ABDOMEN AND PELVIS WITHOUT CONTRAST TECHNIQUE: Multidetector CT imaging of the abdomen and pelvis was performed following the standard protocol without IV contrast. COMPARISON:  07/01/2017 FINDINGS: LOWER CHEST: There is no basilar pleural or apical pericardial effusion. HEPATOBILIARY: The hepatic contours and density are normal. There is no intra- or extrahepatic biliary dilatation. The gallbladder is normal. PANCREAS: The pancreatic parenchymal contours are normal and there is no ductal dilatation. There is no peripancreatic fluid collection. SPLEEN: Normal. ADRENALS/URINARY TRACT: --Adrenal glands: Normal. --Right kidney/ureter: Single nonobstructing renal calculus measuring 8 mm. No hydronephrosis, perinephric stranding or solid renal mass. --Left kidney/ureter: There is a stone within the proximal ureter measuring 8 x 7 mm, causing mild hydroureteronephrosis and moderate perinephric stranding. There is a 4  mm nonobstructive renal calculus at the lower pole. --Urinary bladder: Normal for degree of distention STOMACH/BOWEL: --Stomach/Duodenum: There is no hiatal hernia or other gastric abnormality. The duodenal course and caliber are normal. --Small bowel: No dilatation or inflammation. --Colon: No focal abnormality. --Appendix: Normal. VASCULAR/LYMPHATIC: Normal course and caliber of the major abdominal vessels. No abdominal or pelvic lymphadenopathy. REPRODUCTIVE: Normal prostate size with symmetric seminal vesicles. MUSCULOSKELETAL. There is flowing lower thoracic anterior osteophyte formation. OTHER: None. IMPRESSION: 1. Left proximal ureteral stone measuring 8 x 7 mm causing mild hydroureteronephrosis and moderate perinephric stranding. 2. Single nonobstructing right renal calculus measuring 8 mm. 3. Flowing lower thoracic anterior osteophytes, compatible with diffuse idiopathic skeletal hyperostosis. Electronically Signed   By: Deatra Robinson M.D.   On: 06/23/2018 05:27  Given antibiotics 1:00 with Naima  Procedures Procedures (including critical care time)  Medications Ordered in ED Medications  sodium chloride 0.9 % bolus 1,000 mL (1,000 mLs Intravenous New Bag/Given 06/23/18 0455)  HYDROmorphone (DILAUDID) injection 1 mg (1 mg Intravenous Given 06/23/18 0455)  ondansetron (ZOFRAN) injection 4 mg (4 mg Intravenous Given 06/23/18 0455)  ketorolac (TORADOL) 30 MG/ML injection 30 mg (30 mg Intravenous Given 06/23/18 0455)     Initial Impression / Assessment and Plan / ED Course  I have reviewed the triage vital signs and the nursing notes.  Pertinent labs & imaging results that were available during my care of the patient were reviewed by me and considered in my medical decision making (see chart for details).    Patient with left-sided flank pain that radiates to his stomach similar to previous kidney stone pain.  Patient afebrile.  Vitals are stable.  No vomiting.  UA shows blood with  white cells as well as leukocytes.  We will send culture.  No fever.  No leukocytosis.  CT scan confirms left proximal ureteral stone with hydronephrosis and stranding.  Patient's pain and nausea are well controlled in the ED.  Discussed with Dr. Vernie Ammons of urology.  He recommends urine culture but no antibiotics at this time.  Patient with no fever or leukocytosis.  Patient with no fever, leukocytosis or signs of systemic infection.  Patient's pain and nausea are controlled and he will follow-up with urology.  Return to the ED if worsening pain, vomiting, fever or any other concerns. Final Clinical Impressions(s) / ED Diagnoses   Final diagnoses:  Ureteral colic    ED  Discharge Orders    None       Glynn Octave, MD 06/23/18 [phone]

## 2018-06-23 NOTE — Discharge Instructions (Addendum)
Take the antibiotics, nausea medicine and pain medication as prescribed.  Do not drive or operate heavy machinery if taking the pain medication.  Follow-up with the urologist.  Return to the ED with worsening pain, vomiting, fever or any other concerns.

## 2018-06-23 NOTE — ED Notes (Signed)
Declined W/C at D/C and was escorted to lobby by RN. 

## 2018-06-24 LAB — URINE CULTURE: CULTURE: NO GROWTH

## 2018-10-22 ENCOUNTER — Other Ambulatory Visit: Payer: Self-pay | Admitting: Family Medicine

## 2018-10-22 ENCOUNTER — Other Ambulatory Visit: Payer: Self-pay

## 2018-10-22 ENCOUNTER — Ambulatory Visit: Payer: Self-pay

## 2018-10-22 DIAGNOSIS — M79672 Pain in left foot: Secondary | ICD-10-CM

## 2018-12-05 ENCOUNTER — Ambulatory Visit (HOSPITAL_COMMUNITY)
Admission: EM | Admit: 2018-12-05 | Discharge: 2018-12-05 | Disposition: A | Discharge status: Home / Self Care | Payer: Self-pay | Attending: Emergency Medicine | Admitting: Emergency Medicine

## 2018-12-05 ENCOUNTER — Other Ambulatory Visit: Payer: Self-pay

## 2018-12-05 ENCOUNTER — Encounter (HOSPITAL_COMMUNITY): Payer: Self-pay

## 2018-12-05 DIAGNOSIS — R030 Elevated blood-pressure reading, without diagnosis of hypertension: Secondary | ICD-10-CM

## 2018-12-05 DIAGNOSIS — G43009 Migraine without aura, not intractable, without status migrainosus: Secondary | ICD-10-CM

## 2018-12-05 MED ORDER — DEXAMETHASONE SODIUM PHOSPHATE 10 MG/ML IJ SOLN
10.0000 mg | Freq: Once | INTRAMUSCULAR | Status: AC
  Administered 2018-12-05: 19:00:00 10 mg via INTRAMUSCULAR

## 2018-12-05 MED ORDER — KETOROLAC TROMETHAMINE 60 MG/2ML IM SOLN
60.0000 mg | Freq: Once | INTRAMUSCULAR | Status: AC
  Administered 2018-12-05: 19:00:00 60 mg via INTRAMUSCULAR

## 2018-12-05 MED ORDER — DEXAMETHASONE SODIUM PHOSPHATE 10 MG/ML IJ SOLN
INTRAMUSCULAR | Status: AC
  Filled 2018-12-05: qty 1

## 2018-12-05 MED ORDER — KETOROLAC TROMETHAMINE 60 MG/2ML IM SOLN
INTRAMUSCULAR | Status: AC
  Filled 2018-12-05: qty 2

## 2018-12-05 NOTE — Discharge Instructions (Addendum)
Toradol shot given in office Decadron shot given in office Rest and drink plenty of fluids Follow up with PCP for further evaluation and management of chronic migraines Call 911 or go to the ER if you have any new or worsening symptoms worsening headache, nausea, vomiting, changes in speech, weakness, numbness/tingling in arms or legs, abdominal pain, changes in bowel or bladder function, symptoms do not improve with medications, etc...  Blood pressure elevated in office.  Please recheck in 24 hours.  If it continues to be greater than 140/90 please follow up with PCP for further evaluation and management.

## 2018-12-05 NOTE — ED Triage Notes (Signed)
Patient presents to Urgent Care with complaints of migraines, chronic for a year, but worse the past three days. Patient reports the pain is mostly on the left side, but switches back and forth. Pt thought at first it was sinus irritation and was taking sinus meds, but they would not help for very long. No neuro symptoms during assessment.

## 2018-12-05 NOTE — ED Provider Notes (Signed)
[facility]   [id number] 12/05/18 Arrival Time: 1804  UV:[id number]:HEADACHE  SUBJECTIVE:  Alex [name] is a 53 y.o. male who complains of migraine x 3 days.  Denies a precipitating event, or recent head trauma.  Patient localizes his pain to the left side of his head.  Describes the pain as constant and throbbing in character.  Patient has tried OTC medications including tylenol sinus and ibuprofen without relief.  Tried left over oxycodone without relief.  Denies aggravating factors including light and noise.  Reports similar symptoms in the past that improved with OTC medications.  Pain is 8/10.  Reports hx of chronic migraines for the past year that he thought was related to sinuses.  Patient denies fever, chills, nausea, vomiting, aura, rhinorrhea, watery eyes, chest pain, SOB, abdominal pain, weakness, numbness or tingling.    ROS: As per HPI.  Past Medical History:  Diagnosis Date  . Bone spur   . Flu   . Kidney stone    History reviewed. No pertinent surgical history. No Known Allergies No current facility-administered medications on file prior to encounter.    Current Outpatient Medications on File Prior to Encounter  Medication Sig Dispense Refill  . furosemide (LASIX) 20 MG tablet Take 2 tablets (40 mg total) by mouth daily as needed. Take in the morning. 90 tablet 0  . ibuprofen (ADVIL,MOTRIN) 600 MG tablet Take 1 tablet (600 mg total) by mouth every 6 (six) hours as needed. 30 tablet 0  . potassium chloride SA (K-DUR,KLOR-CON) 20 MEQ tablet Take 1 tablet (20 mEq total) by mouth daily. (Patient not taking: Reported on 06/23/2018) 90 tablet 2   Social History   Socioeconomic History  . Marital status: Married    Spouse name: Not on file  . Number of children: Not on file  . Years of education: Not on file  . Highest education level: Not on file  Occupational History  . Not on file  Social Needs  . Financial resource strain: Not on file  . Food insecurity:   Worry: Not on file    Inability: Not on file  . Transportation needs:    Medical: Not on file    Non-medical: Not on file  Tobacco Use  . Smoking status: Current Every Day Smoker    Packs/day: 0.10    Types: Cigarettes    Last attempt to quit: 02/16/2017    Years since quitting: 1.8  . Smokeless tobacco: Never Used  Substance and Sexual Activity  . Alcohol use: No    Comment: former quit January 2018  . Drug use: No    Types: Cocaine    Comment: last use in April 2018  . Sexual activity: Not on file  Lifestyle  . Physical activity:    Days per week: Not on file    Minutes per session: Not on file  . Stress: Not on file  Relationships  . Social connections:    Talks on phone: Not on file    Gets together: Not on file    Attends religious service: Not on file    Active member of club or organization: Not on file    Attends meetings of clubs or organizations: Not on file    Relationship status: Not on file  . Intimate partner violence:    Fear of current or ex partner: Not on file    Emotionally abused: Not on file    Physically abused: Not on file    Forced sexual activity:  Not on file  Other Topics Concern  . Not on file  Social History Narrative  . Not on file   Family History  Problem Relation Age of Onset  . Hypertension Mother   . Kidney disease Mother   . Heart disease Mother   . Cancer Father     OBJECTIVE:  Vitals:   12/05/18 1824  BP: (!) 158/96  Pulse: 95  Resp: 17  Temp: 98.8 F (37.1 C)  TempSrc: Oral  SpO2: 100%    General appearance: alert; no distress Eyes: PERRLA; EOMI HENT: normocephalic; atraumatic; TTP over left temple Neck: supple with FROM Lungs: clear to auscultation bilaterally Heart: regular rate and rhythm.  Radial pulses 2+ symmetrical bilaterally Extremities: no edema; symmetrical with no gross deformities Skin: warm and dry Neurologic: CN 2-12 grossly intact; finger to nose without difficulty; normal gait; 2+ symmetric  patellar reflexes; strength and sensation intact bilaterally about the upper and lower extremities Psychological: alert and cooperative; normal mood and affect  ASSESSMENT & PLAN:  1. Migraine without aura and without status migrainosus, not intractable   2. Elevated blood pressure reading     Meds ordered this encounter  Medications  . ketorolac (TORADOL) injection 60 mg  . dexamethasone (DECADRON) injection 10 mg   Toradol shot given in office Decadron shot given in office Rest and drink plenty of fluids Follow up with PCP for further evaluation and management of chronic migraines Call 911 or go to the ER if you have any new or worsening symptoms worsening headache, nausea, vomiting, changes in speech, weakness, numbness/tingling in arms or legs, abdominal pain, changes in bowel or bladder function, symptoms do not improve with medications, etc...  Blood pressure elevated in office.  Please recheck in 24 hours.  If it continues to be greater than 140/90 please follow up with PCP for further evaluation and management.    Reviewed expectations re: course of current medical issues. Questions answered. Outlined signs and symptoms indicating need for more acute intervention. Patient verbalized understanding. After Visit Summary given.   Rennis Harding, PA-C 12/05/18 1858

## 2018-12-08 ENCOUNTER — Other Ambulatory Visit: Payer: Self-pay

## 2018-12-08 ENCOUNTER — Emergency Department (HOSPITAL_COMMUNITY): Payer: Self-pay

## 2018-12-08 ENCOUNTER — Emergency Department (HOSPITAL_COMMUNITY)
Admission: EM | Admit: 2018-12-08 | Discharge: 2018-12-08 | Disposition: A | Discharge status: Home / Self Care | Payer: Self-pay | Attending: Emergency Medicine | Admitting: Emergency Medicine

## 2018-12-08 DIAGNOSIS — R519 Headache, unspecified: Secondary | ICD-10-CM

## 2018-12-08 DIAGNOSIS — Z79899 Other long term (current) drug therapy: Secondary | ICD-10-CM | POA: Insufficient documentation

## 2018-12-08 DIAGNOSIS — R51 Headache: Secondary | ICD-10-CM | POA: Insufficient documentation

## 2018-12-08 DIAGNOSIS — F17210 Nicotine dependence, cigarettes, uncomplicated: Secondary | ICD-10-CM | POA: Insufficient documentation

## 2018-12-08 MED ORDER — SODIUM CHLORIDE 0.9 % IV BOLUS
1000.0000 mL | Freq: Once | INTRAVENOUS | Status: AC
  Administered 2018-12-08: 1000 mL via INTRAVENOUS

## 2018-12-08 MED ORDER — KETOROLAC TROMETHAMINE 30 MG/ML IJ SOLN
30.0000 mg | Freq: Once | INTRAMUSCULAR | Status: AC
  Administered 2018-12-08: 30 mg via INTRAVENOUS
  Filled 2018-12-08: qty 1

## 2018-12-08 MED ORDER — BUTALBITAL-APAP-CAFFEINE 50-325-40 MG PO TABS: 1.0000 | ORAL_TABLET | Freq: Four times a day (QID) | ORAL | 0 refills | Status: AC | PRN

## 2018-12-08 MED ORDER — METOCLOPRAMIDE HCL 5 MG/ML IJ SOLN
10.0000 mg | Freq: Once | INTRAMUSCULAR | Status: AC
  Administered 2018-12-08: 10 mg via INTRAVENOUS
  Filled 2018-12-08: qty 2

## 2018-12-08 NOTE — ED Triage Notes (Signed)
Pt here for recurrent migraine onset last night. Took 1 g Tylenol without relief.

## 2018-12-08 NOTE — ED Notes (Signed)
Patient transported to CT 

## 2018-12-08 NOTE — Discharge Instructions (Signed)
Follow-up with the neurologist provided.  The CT scan did not show any abnormalities at this time.  Return here for any worsening in your condition.  Increase your fluid intake as much as possible.

## 2018-12-08 NOTE — ED Provider Notes (Signed)
[facility] EMERGENCY DEPARTMENT Provider Note   CSN: [id number] Arrival date & time: 12/08/18  0706    History   Chief Complaint Chief Complaint  Patient presents with  . Headache    HPI Alex [name] is a 53 y.o. male.     HPI Patient presents to the emergency department with intermittent headache over the last 3 months.  The patient states that he was seen at the urgent care and given treatment for the headache.  Patient states that nothing seems to make his condition better or worse.  He states he does not have any light or sound intolerances.  Patient states that he does not have a history of migraine headaches.  Patient states that he is concerned that he may have had a sinus infection and that is what is causing his symptoms over the last few months.  The patient denies chest pain, shortness of breath,blurred vision, neck pain, fever, cough, weakness, numbness, dizziness, anorexia, edema, abdominal pain, nausea, vomiting, diarrhea, rash, back pain, dysuria, hematemesis, bloody stool, near syncope, or syncope.  Patient states that he tried Tylenol without relief of his symptoms. Past Medical History:  Diagnosis Date  . Bone spur   . Flu   . Kidney stone     Patient Active Problem List   Diagnosis Date Noted  . Neuropathy 04/11/2017  . Essential hypertension 04/11/2017  . Chronic pain of both knees 04/11/2017  . Right hand pain 04/11/2017  . Pain of right heel 04/11/2017    No past surgical history on file.      Home Medications    Prior to Admission medications   Medication Sig Start Date End Date Taking? Authorizing Provider  furosemide (LASIX) 20 MG tablet Take 2 tablets (40 mg total) by mouth daily as needed. Take in the morning. 07/13/17   Bing Neighbors, FNP  ibuprofen (ADVIL,MOTRIN) 600 MG tablet Take 1 tablet (600 mg total) by mouth every 6 (six) hours as needed. 06/23/18   Rancour, Jeannett Senior, MD  potassium chloride SA (K-DUR,KLOR-CON)  20 MEQ tablet Take 1 tablet (20 mEq total) by mouth daily. Patient not taking: Reported on 06/23/2018 07/13/17   Bing Neighbors, FNP    Family History Family History  Problem Relation Age of Onset  . Hypertension Mother   . Kidney disease Mother   . Heart disease Mother   . Cancer Father     Social History Social History   Tobacco Use  . Smoking status: Current Every Day Smoker    Packs/day: 0.10    Types: Cigarettes    Last attempt to quit: 02/16/2017    Years since quitting: 1.8  . Smokeless tobacco: Never Used  Substance Use Topics  . Alcohol use: No    Comment: former quit January 2018  . Drug use: No    Types: Cocaine    Comment: last use in April 2018     Allergies   Patient has no known allergies.   Review of Systems Review of Systems All other systems negative except as documented in the HPI. All pertinent positives and negatives as reviewed in the HPI.  Physical Exam Updated Vital Signs BP 138/75   Pulse (!) 57   Temp 97.8 F (36.6 C) (Oral)   Resp 12   Ht 6\' 3"  (1.905 m)   Wt (!) 137.4 kg   SpO2 95%   BMI 37.87 kg/m   Physical Exam Vitals signs and nursing note reviewed.  Constitutional:  General: He is not in acute distress.    Appearance: He is well-developed.  HENT:     Head: Normocephalic and atraumatic.  Eyes:     Pupils: Pupils are equal, round, and reactive to light.  Neck:     Musculoskeletal: Normal range of motion and neck supple.  Cardiovascular:     Rate and Rhythm: Normal rate and regular rhythm.     Heart sounds: Normal heart sounds. No murmur. No friction rub. No gallop.   Pulmonary:     Effort: Pulmonary effort is normal. No respiratory distress.     Breath sounds: Normal breath sounds. No wheezing.  Abdominal:     General: Bowel sounds are normal. There is no distension.     Palpations: Abdomen is soft.     Tenderness: There is no abdominal tenderness.  Skin:    General: Skin is warm and dry.     Capillary  Refill: Capillary refill takes less than 2 seconds.     Findings: No erythema or rash.  Neurological:     Mental Status: He is alert and oriented to person, place, and time.     GCS: GCS eye subscore is 4. GCS verbal subscore is 5. GCS motor subscore is 6.     Sensory: No sensory deficit.     Motor: No weakness or abnormal muscle tone.     Coordination: Coordination normal.     Gait: Gait normal.     Deep Tendon Reflexes: Reflexes normal.  Psychiatric:        Behavior: Behavior normal.      ED Treatments / Results  Labs (all labs ordered are listed, but only abnormal results are displayed) Labs Reviewed - No data to display  EKG None  Radiology Ct Head Wo Contrast  Result Date: 12/08/2018 CLINICAL DATA:  Left-sided headache. EXAM: CT HEAD WITHOUT CONTRAST TECHNIQUE: Contiguous axial images were obtained from the base of the skull through the vertex without intravenous contrast. COMPARISON:  None. FINDINGS: Brain: No evidence of acute infarction, hemorrhage, hydrocephalus, extra-axial collection or mass lesion/mass effect. Vascular: No hyperdense vessel or unexpected calcification. Skull: Normal. Negative for fracture or focal lesion. Sinuses/Orbits: No acute finding. Other: None. IMPRESSION: 1. Normal noncontrast head CT. Electronically Signed   By: Obie DredgeWilliam T Derry M.D.   On: 12/08/2018 08:49    Procedures Procedures (including critical care time)  Medications Ordered in ED Medications  sodium chloride 0.9 % bolus 1,000 mL (0 mLs Intravenous Stopped 12/08/18 0835)  metoCLOPramide (REGLAN) injection 10 mg (10 mg Intravenous Given 12/08/18 0827)  ketorolac (TORADOL) 30 MG/ML injection 30 mg (30 mg Intravenous Given 12/08/18 0826)     Initial Impression / Assessment and Plan / ED Course  I have reviewed the triage vital signs and the nursing notes.  Pertinent labs & imaging results that were available during my care of the patient were reviewed by me and considered in my medical  decision making (see chart for details).       Patient has complete resolution of his symptoms here in the emergency department.  He was given IV fluids along with Toradol and Reglan.  The patient to be discharged home and given follow-up with neurology.  The patient does not have any neurological deficits noted on examination.  CT scan did not show any obvious intracranial abnormalities at this time.  Final Clinical Impressions(s) / ED Diagnoses   Final diagnoses:  None    ED Discharge Orders    None  Charlestine Night, PA-C 12/08/18 1028    Derwood Kaplan, MD 12/09/18 [phone]

## 2018-12-26 ENCOUNTER — Encounter (HOSPITAL_COMMUNITY): Payer: Self-pay

## 2018-12-26 ENCOUNTER — Telehealth (HOSPITAL_COMMUNITY): Payer: Self-pay | Admitting: Emergency Medicine

## 2018-12-26 ENCOUNTER — Other Ambulatory Visit: Payer: Self-pay

## 2018-12-26 ENCOUNTER — Ambulatory Visit (HOSPITAL_COMMUNITY)
Admission: EM | Admit: 2018-12-26 | Discharge: 2018-12-26 | Disposition: A | Discharge status: Home / Self Care | Payer: Self-pay | Attending: Family Medicine | Admitting: Family Medicine

## 2018-12-26 DIAGNOSIS — I10 Essential (primary) hypertension: Secondary | ICD-10-CM

## 2018-12-26 DIAGNOSIS — G500 Trigeminal neuralgia: Secondary | ICD-10-CM

## 2018-12-26 MED ORDER — CARBAMAZEPINE 100 MG PO CHEW: 100.0000 mg | CHEWABLE_TABLET | Freq: Two times a day (BID) | ORAL | 1 refills | Status: DC

## 2018-12-26 MED ORDER — KETOROLAC TROMETHAMINE 60 MG/2ML IM SOLN
60.0000 mg | Freq: Once | INTRAMUSCULAR | Status: AC
  Administered 2018-12-26: 60 mg via INTRAMUSCULAR

## 2018-12-26 MED ORDER — KETOROLAC TROMETHAMINE 60 MG/2ML IM SOLN
INTRAMUSCULAR | Status: AC
  Filled 2018-12-26: qty 2

## 2018-12-26 NOTE — ED Provider Notes (Addendum)
[facility]    CSN: [id number] Arrival date & time: 12/26/18  0807     History   Chief Complaint Chief Complaint  Patient presents with  . Migraine    HPI Alex [name] is a 53 y.o. male.   Patient is a 53 year old male with past medical history of hypertension, neuropathy.  He presents today with severe, sharp, stabbing pain to the left side of his face.  This is been an ongoing problem for him that has been intermittent, waxing waning over the past month.  He is also had some tearing of the eyes.  He has been seen for this multiple times and diagnosed with migraine and possible TMJ.  Reports that he will go a few days without symptoms and then the symptoms will come on suddenly.  This episode started about 130 this morning.  He has been using a mouthguard which he reports helps some.  He is also been taking extra strength Tylenol.  Denies any dizziness or blurred vision associated.  Denies any nausea.  No weakness, slurred speech.  He was seen in the ER recently with normal CT scan.  ROS per HPI      Past Medical History:  Diagnosis Date  . Bone spur   . Flu   . Kidney stone     Patient Active Problem List   Diagnosis Date Noted  . Neuropathy 04/11/2017  . Essential hypertension 04/11/2017  . Chronic pain of both knees 04/11/2017  . Right hand pain 04/11/2017  . Pain of right heel 04/11/2017    History reviewed. No pertinent surgical history.     Home Medications    Prior to Admission medications   Medication Sig Start Date End Date Taking? Authorizing Provider  butalbital-acetaminophen-caffeine (FIORICET) 50-325-40 MG tablet Take 1 tablet by mouth every 6 (six) hours as needed for headache. 12/08/18 12/08/19  Lawyer, Cristal Deer, PA-C  carbamazepine (TEGRETOL) 100 MG chewable tablet Chew 1 tablet (100 mg total) by mouth 2 (two) times daily. 12/26/18   Dahlia Byes A, NP  furosemide (LASIX) 20 MG tablet Take 2 tablets (40 mg total) by mouth daily as  needed. Take in the morning. 07/13/17   Bing Neighbors, FNP  ibuprofen (ADVIL,MOTRIN) 600 MG tablet Take 1 tablet (600 mg total) by mouth every 6 (six) hours as needed. 06/23/18   Rancour, Jeannett Senior, MD  potassium chloride SA (K-DUR,KLOR-CON) 20 MEQ tablet Take 1 tablet (20 mEq total) by mouth daily. Patient not taking: Reported on 06/23/2018 07/13/17   Bing Neighbors, FNP    Family History Family History  Problem Relation Age of Onset  . Hypertension Mother   . Kidney disease Mother   . Heart disease Mother   . Cancer Father     Social History Social History   Tobacco Use  . Smoking status: Current Every Day Smoker    Packs/day: 0.10    Types: Cigarettes    Last attempt to quit: 02/16/2017    Years since quitting: 1.8  . Smokeless tobacco: Never Used  Substance Use Topics  . Alcohol use: No    Comment: former quit January 2018  . Drug use: No    Types: Cocaine    Comment: last use in April 2018     Allergies   Patient has no known allergies.   Review of Systems Review of Systems   Physical Exam Triage Vital Signs ED Triage Vitals  Enc Vitals Group     BP 12/26/18 0819 Marland Kitchen)  158/103     Pulse Rate 12/26/18 0819 69     Resp 12/26/18 0819 17     Temp 12/26/18 0819 98.2 F (36.8 C)     Temp Source 12/26/18 0819 Oral     SpO2 12/26/18 0819 98 %     Weight --      Height --      Head Circumference --      Peak Flow --      Pain Score 12/26/18 0818 8     Pain Loc --      Pain Edu? --      Excl. in GC? --    No data found.  Updated Vital Signs BP (!) 158/103 (BP Location: Left Arm) Comment: 10/10 pain  Pulse 69   Temp 98.2 F (36.8 C) (Oral)   Resp 17   SpO2 98%   Visual Acuity Right Eye Distance:   Left Eye Distance:   Bilateral Distance:    Right Eye Near:   Left Eye Near:    Bilateral Near:     Physical Exam Vitals signs and nursing note reviewed.  Constitutional:      Comments: Patient appears in severe pain, squinting eyes and tearing  of eyes.  HENT:     Head: Normocephalic and atraumatic.     Comments: Tenderness to left temple and facial area. No erythema, swelling. Multiple dental caries in mouth but no obvious dental abscesses or infection.    Right Ear: Tympanic membrane and ear canal normal.     Left Ear: Tympanic membrane and ear canal normal.     Nose: Nose normal.     Mouth/Throat:     Pharynx: Oropharynx is clear.  Eyes:     Extraocular Movements: Extraocular movements intact.     Pupils: Pupils are equal, round, and reactive to light.     Comments: Scleral injection bilateral  Neck:     Musculoskeletal: Normal range of motion.  Cardiovascular:     Rate and Rhythm: Normal rate and regular rhythm.     Pulses: Normal pulses.     Heart sounds: Normal heart sounds.  Pulmonary:     Effort: Pulmonary effort is normal.     Breath sounds: Normal breath sounds.  Musculoskeletal: Normal range of motion.  Lymphadenopathy:     Cervical: No cervical adenopathy.  Skin:    General: Skin is warm and dry.     Findings: No rash.  Neurological:     General: No focal deficit present.     Mental Status: He is oriented to person, place, and time.     Cranial Nerves: No cranial nerve deficit.     Sensory: No sensory deficit.  Psychiatric:        Mood and Affect: Mood normal.      UC Treatments / Results  Labs (all labs ordered are listed, but only abnormal results are displayed) Labs Reviewed - No data to display  EKG None  Radiology No results found.  Procedures Procedures (including critical care time)  Medications Ordered in UC Medications  ketorolac (TORADOL) injection 60 mg (60 mg Intramuscular Given 12/26/18 0844)    Initial Impression / Assessment and Plan / UC Course  I have reviewed the triage vital signs and the nursing notes.  Pertinent labs & imaging results that were available during my care of the patient were reviewed by me and considered in my medical decision making (see chart for  details).      Trigeminal  neuralgia  Symptoms consistent with trigeminal neuralgia. Toradol injection given here in clinic for severe pain We will start him on carbamazepine 100 mg twice daily Warned about possible side effects. He will need close follow-up and he has an appointment scheduled for the 26th of may  Patient understand and agree.  Final Clinical Impressions(s) / UC Diagnoses   Final diagnoses:  Trigeminal neuralgia of left side of face     Discharge Instructions     I believe that you have a condition called trigeminal neuralgia. I am putting some information in your discharge instructions for you to read about this. There is a medication that can help with the pain that you are having.  This medication is called carbamazepine. I will send this to the pharmacy.  You will start with 100 mg twice a day.  You can take up to 200 mg twice a day if needed.  Do not exceed 1200 mg a day You need to see a specialist or primary care to continue this treatment if this works This medicine may make you drowsy, dizzy or nauseous.  Usually this is in more high doses.  With that is while you are starting you at a low dose.     ED Prescriptions    Medication Sig Dispense Auth. Provider   carbamazepine (TEGRETOL) 100 MG chewable tablet Chew 1 tablet (100 mg total) by mouth 2 (two) times daily. 60 tablet Dahlia Byes A, NP     Controlled Substance Prescriptions [place] Controlled Substance Registry consulted? Not Applicable        Janace Aris, NP 12/26/18 1004

## 2018-12-26 NOTE — ED Triage Notes (Signed)
Patient presents to Urgent Care with complaints of intermittent migraines since about a month ago. Patient reports he has been here, to the ED, and an UC in [place]. Someone with [facility] suggested it could be TMJ pain from teeth grinding and he has been wearing a mouth guard, which helped for one night. Pt still wears guard as much as possible but it is no longer helping the pain, pt cannot sleep through the night.

## 2018-12-26 NOTE — Discharge Instructions (Addendum)
I believe that you have a condition called trigeminal neuralgia. I am putting some information in your discharge instructions for you to read about this. There is a medication that can help with the pain that you are having.  This medication is called carbamazepine. I will send this to the pharmacy.  You will start with 100 mg twice a day.  You can take up to 200 mg twice a day if needed.  Do not exceed 1200 mg a day You need to see a specialist or primary care to continue this treatment if this works This medicine may make you drowsy, dizzy or nauseous.  Usually this is in more high doses.  With that is while you are starting you at a low dose.

## 2018-12-26 NOTE — Telephone Encounter (Signed)
Pharmacy change, med was not available at [facility], called and canceled order, will send to [facility] pharmacy

## 2018-12-30 ENCOUNTER — Emergency Department (HOSPITAL_COMMUNITY)
Admission: EM | Admit: 2018-12-30 | Discharge: 2018-12-30 | Disposition: A | Discharge status: Home / Self Care | Payer: Self-pay | Attending: Emergency Medicine | Admitting: Emergency Medicine

## 2018-12-30 ENCOUNTER — Other Ambulatory Visit: Payer: Self-pay

## 2018-12-30 ENCOUNTER — Encounter (HOSPITAL_COMMUNITY): Payer: Self-pay | Admitting: *Deleted

## 2018-12-30 ENCOUNTER — Emergency Department (HOSPITAL_COMMUNITY): Payer: Self-pay

## 2018-12-30 DIAGNOSIS — F17210 Nicotine dependence, cigarettes, uncomplicated: Secondary | ICD-10-CM | POA: Insufficient documentation

## 2018-12-30 DIAGNOSIS — Z79899 Other long term (current) drug therapy: Secondary | ICD-10-CM | POA: Insufficient documentation

## 2018-12-30 DIAGNOSIS — I10 Essential (primary) hypertension: Secondary | ICD-10-CM | POA: Insufficient documentation

## 2018-12-30 DIAGNOSIS — N200 Calculus of kidney: Secondary | ICD-10-CM | POA: Insufficient documentation

## 2018-12-30 LAB — CBC WITH DIFFERENTIAL/PLATELET
Abs Immature Granulocytes: 0.02 10*3/uL (ref 0.00–0.07)
Basophils Absolute: 0.1 10*3/uL (ref 0.0–0.1)
Basophils Relative: 1 %
Eosinophils Absolute: 0.1 10*3/uL (ref 0.0–0.5)
Eosinophils Relative: 1 %
HCT: 47.2 % (ref 39.0–52.0)
Hemoglobin: 15.6 g/dL (ref 13.0–17.0)
Immature Granulocytes: 0 %
Lymphocytes Relative: 38 %
Lymphs Abs: 3 10*3/uL (ref 0.7–4.0)
MCH: 29.5 pg (ref 26.0–34.0)
MCHC: 33.1 g/dL (ref 30.0–36.0)
MCV: 89.2 fL (ref 80.0–100.0)
Monocytes Absolute: 0.8 10*3/uL (ref 0.1–1.0)
Monocytes Relative: 10 %
Neutro Abs: 4 10*3/uL (ref 1.7–7.7)
Neutrophils Relative %: 50 %
Platelets: 255 10*3/uL (ref 150–400)
RBC: 5.29 MIL/uL (ref 4.22–5.81)
RDW: 13.2 % (ref 11.5–15.5)
WBC: 7.9 10*3/uL (ref 4.0–10.5)
nRBC: 0 % (ref 0.0–0.2)

## 2018-12-30 LAB — BASIC METABOLIC PANEL
Anion gap: 8 (ref 5–15)
BUN: 13 mg/dL (ref 6–20)
CO2: 25 mmol/L (ref 22–32)
Calcium: 8.8 mg/dL — ABNORMAL LOW (ref 8.9–10.3)
Chloride: 105 mmol/L (ref 98–111)
Creatinine, Ser: 1.12 mg/dL (ref 0.61–1.24)
GFR calc Af Amer: >60 mL/min (ref >60)
GFR calc non Af Amer: >60 mL/min (ref >60)
Glucose, Bld: 139 mg/dL — ABNORMAL HIGH (ref 70–99)
Potassium: 4.7 mmol/L (ref 3.5–5.1)
Sodium: 138 mmol/L (ref 135–145)

## 2018-12-30 MED ORDER — OXYCODONE-ACETAMINOPHEN 5-325 MG PO TABS: 1.0000 | ORAL_TABLET | ORAL | 0 refills | Status: DC | PRN

## 2018-12-30 MED ORDER — HYDROMORPHONE HCL 1 MG/ML IJ SOLN
1.0000 mg | Freq: Once | INTRAMUSCULAR | Status: AC
  Administered 2018-12-30: 1 mg via INTRAVENOUS
  Filled 2018-12-30: qty 1

## 2018-12-30 MED ORDER — SODIUM CHLORIDE 0.9 % IV BOLUS
1000.0000 mL | Freq: Once | INTRAVENOUS | Status: AC
  Administered 2018-12-30: 1000 mL via INTRAVENOUS

## 2018-12-30 MED ORDER — ONDANSETRON HCL 4 MG/2ML IJ SOLN
4.0000 mg | Freq: Once | INTRAMUSCULAR | Status: AC
  Administered 2018-12-30: 4 mg via INTRAVENOUS
  Filled 2018-12-30: qty 2

## 2018-12-30 NOTE — ED Notes (Signed)
c-t person came to take him to c-t pt refused to go until he had pain med

## 2018-12-30 NOTE — ED Triage Notes (Signed)
The pt has had lt flank pain for 3-4 days.  The pt had a kidney stone 6 months ago  He reports that he has another stone

## 2018-12-30 NOTE — Discharge Instructions (Signed)
Drink plenty of fluids. Take ibuprofen or naproxen as needed for pain. °

## 2018-12-30 NOTE — ED Provider Notes (Signed)
[facility]   CSN: [id number] Arrival date & time: 12/30/18  [id number]    History   Chief Complaint Chief Complaint  Patient presents with   Flank Pain    HPI Alex [name] is a 53 y.o. male.   The history is provided by the patient.  He has history of hypertension and kidney stones and comes in with left flank pain started about 1:30 AM consistent with his prior kidney stones.  Pain does radiate to the left lower quadrant.  Pain is rated at 10/10.  There is no associated nausea or vomiting.  He denies fever or chills.  Denies any urinary difficulty.  He has not taken anything for pain.  He had a 7 mm calculus 6 months ago which never passed.  Past Medical History:  Diagnosis Date   Bone spur    Flu    Kidney stone     Patient Active Problem List   Diagnosis Date Noted   Neuropathy 04/11/2017   Essential hypertension 04/11/2017   Chronic pain of both knees 04/11/2017   Right hand pain 04/11/2017   Pain of right heel 04/11/2017    History reviewed. No pertinent surgical history.      Home Medications    Prior to Admission medications   Medication Sig Start Date End Date Taking? Authorizing Provider  butalbital-acetaminophen-caffeine (FIORICET) 50-325-40 MG tablet Take 1 tablet by mouth every 6 (six) hours as needed for headache. 12/08/18 12/08/19  Lawyer, Cristal Deerhristopher, PA-C  carbamazepine (TEGRETOL) 100 MG chewable tablet Chew 1 tablet (100 mg total) by mouth 2 (two) times daily. 12/26/18   Eustace MooreNelson, Yvonne Sue, MD  furosemide (LASIX) 20 MG tablet Take 2 tablets (40 mg total) by mouth daily as needed. Take in the morning. 07/13/17   Bing NeighborsHarris, Kimberly S, FNP  ibuprofen (ADVIL,MOTRIN) 600 MG tablet Take 1 tablet (600 mg total) by mouth every 6 (six) hours as needed. 06/23/18   Rancour, Jeannett SeniorStephen, MD  potassium chloride SA (K-DUR,KLOR-CON) 20 MEQ tablet Take 1 tablet (20 mEq total) by mouth daily. Patient not taking:  Reported on 06/23/2018 07/13/17   Bing NeighborsHarris, Kimberly S, FNP    Family History Family History  Problem Relation Age of Onset   Hypertension Mother    Kidney disease Mother    Heart disease Mother    Cancer Father     Social History Social History   Tobacco Use   Smoking status: Current Every Day Smoker    Packs/day: 0.10    Types: Cigarettes    Last attempt to quit: 02/16/2017    Years since quitting: 1.8   Smokeless tobacco: Never Used  Substance Use Topics   Alcohol use: No    Comment: former quit January 2018   Drug use: No    Types: Cocaine    Comment: last use in April 2018     Allergies   Patient has no known allergies.   Review of Systems Review of Systems  All other systems reviewed and are negative.    Physical Exam Updated Vital Signs BP (!) 153/77 (BP Location: Right Arm)    Pulse 70    Temp 98.3 F (36.8 C) (Oral)    Resp 18    SpO2 97%   Physical Exam Vitals signs and nursing note reviewed.    53 year old male, resting comfortably and in no acute distress. Vital signs are significant for elevated blood pressure. Oxygen saturation is 97%, which is normal. Head  is normocephalic and atraumatic. PERRLA, EOMI. Oropharynx is clear. Neck is nontender and supple without adenopathy or JVD. Back is nontender and there is no CVA tenderness. Lungs are clear without rales, wheezes, or rhonchi. Chest is nontender. Heart has regular rate and rhythm without murmur. Abdomen is soft, flat, with mild left lower quadrant tenderness.  There is no rebound or guarding.  There are no masses or hepatosplenomegaly and peristalsis is hypoactive. Extremities have no cyanosis or edema, full range of motion is present. Skin is warm and dry without rash. Neurologic: Mental status is normal, cranial nerves are intact, there are no motor or sensory deficits.  ED Treatments / Results  Labs (all labs ordered are listed, but only abnormal results are displayed) Labs  Reviewed  BASIC METABOLIC PANEL - Abnormal; Notable for the following components:      Result Value   Glucose, Bld 139 (*)    Calcium 8.8 (*)    All other components within normal limits  CBC WITH DIFFERENTIAL/PLATELET  URINALYSIS, ROUTINE W REFLEX MICROSCOPIC   Radiology Ct Renal Stone Study  Result Date: 12/30/2018 CLINICAL DATA:  53 year old male with history of flank pain for the past 3-4 days. History of kidney stones. EXAM: CT ABDOMEN AND PELVIS WITHOUT CONTRAST TECHNIQUE: Multidetector CT imaging of the abdomen and pelvis was performed following the standard protocol without IV contrast. COMPARISON:  CT the abdomen and pelvis 06/23/2018. FINDINGS: Lower chest: Unremarkable. Hepatobiliary: No definite cystic or solid hepatic lesions are confidently identified in the liver on today's noncontrast CT examination. Mild diffuse low attenuation throughout the hepatic parenchyma, indicative of hepatic steatosis. Unenhanced appearance of the gallbladder is normal. Pancreas: No definite pancreatic mass or peripancreatic fluid or inflammatory changes. Spleen: Unremarkable. Adrenals/Urinary Tract: 1 cm nonobstructive calculus in the lower pole collecting system of the left kidney. In addition, in the urinary bladder slightly to the left of midline at or immediately beyond the left ureterovesicular junction there is a 9 mm calculus. This is associated with mild-to-moderate proximal left hydroureteronephrosis and perinephric stranding. Unenhanced appearance of the kidneys is otherwise unremarkable. Bilateral adrenal glands are normal in appearance. Urinary bladder is otherwise unremarkable in appearance. Stomach/Bowel: Unenhanced appearance of the stomach is normal. No pathologic dilatation of small bowel or colon. A few scattered colonic diverticulae are noted, most evident in the proximal sigmoid colon, without surrounding inflammatory changes to suggest an acute diverticulitis at this time. Normal appendix.  Vascular/Lymphatic: Mild atherosclerotic calcifications in the pelvic vasculature. No lymphadenopathy noted in the abdomen or pelvis. Reproductive: Prostate gland and seminal vesicles are unremarkable in appearance. Other: No significant volume of ascites.  No pneumoperitoneum. Musculoskeletal: There are no aggressive appearing lytic or blastic lesions noted in the visualized portions of the skeleton. IMPRESSION: 1. 9 mm calculus in the urinary bladder at or immediately beyond the left ureterovesicular junction with mild to moderate proximal left hydroureteronephrosis and extensive perinephric stranding. 2. Additional 1 cm nonobstructive calculus in the lower pole collecting system of the right kidney. 3. Hepatic steatosis. 4. Colonic diverticulosis without evidence of acute diverticulitis at this time. 5. Atherosclerosis. Electronically Signed   By: Trudie Reed M.D.   On: 12/30/2018 04:39    Procedures Procedures   Medications Ordered in ED Medications  sodium chloride 0.9 % bolus 1,000 mL (has no administration in time range)  HYDROmorphone (DILAUDID) injection 1 mg (has no administration in time range)  ondansetron (ZOFRAN) injection 4 mg (has no administration in time range)     Initial Impression /  Assessment and Plan / ED Course  I have reviewed the triage vital signs and the nursing notes.  Pertinent labs & imaging results that were available during my care of the patient were reviewed by me and considered in my medical decision making (see chart for details).  Left flank pain suspicious for urolithiasis and ureteral colic.  Old records are reviewed, and renal stone protocol CT scan last November did show an 8 x 7 mm proximal ureteral calculus on the left.  No evidence of abdominal aneurysm seen.  He will be given IV fluids, hydromorphone, ondansetron and will be sent for repeat renal stone protocol CT scan.  Labs are unremarkable.  CT scan shows 9 mm calculus is already passed from  the left ureter into the bladder, but with residual hydronephrosis.  He had good pain relief with above-noted treatment.  Advised that pain should subside fairly quickly with stone having passed.  He is discharged with prescription for small number of oxycodone-acetaminophen tablets, told to use over-the-counter NSAIDs.  He is referred to urology for follow-up.  Of note, there is a 1 cm calculus in the right kidney.   Final Clinical Impressions(s) / ED Diagnoses   Final diagnoses:  Kidney stone    ED Discharge Orders         Ordered    oxyCODONE-acetaminophen (PERCOCET) 5-325 MG tablet  Every 4 hours PRN,   Status:  Discontinued     12/30/18 0452    oxyCODONE-acetaminophen (PERCOCET) 5-325 MG tablet  Every 4 hours PRN     12/30/18 0453           Dione Booze, MD 12/30/18 [phone]

## 2018-12-31 ENCOUNTER — Ambulatory Visit: Payer: Self-pay | Attending: Nurse Practitioner | Admitting: Nurse Practitioner

## 2018-12-31 ENCOUNTER — Other Ambulatory Visit: Payer: Self-pay

## 2018-12-31 ENCOUNTER — Encounter: Payer: Self-pay | Admitting: Nurse Practitioner

## 2018-12-31 DIAGNOSIS — I10 Essential (primary) hypertension: Secondary | ICD-10-CM

## 2018-12-31 DIAGNOSIS — N219 Calculus of lower urinary tract, unspecified: Secondary | ICD-10-CM

## 2018-12-31 DIAGNOSIS — F17200 Nicotine dependence, unspecified, uncomplicated: Secondary | ICD-10-CM

## 2018-12-31 MED ORDER — CARBAMAZEPINE ER 400 MG PO TB12: 400.0000 mg | ORAL_TABLET | Freq: Two times a day (BID) | ORAL | 0 refills | Status: DC

## 2018-12-31 MED ORDER — ACETAMINOPHEN-CODEINE #3 300-30 MG PO TABS: 1.0000 | ORAL_TABLET | Freq: Three times a day (TID) | ORAL | 0 refills | Status: DC | PRN

## 2018-12-31 MED ORDER — TAMSULOSIN HCL 0.4 MG PO CAPS: 0.4000 mg | ORAL_CAPSULE | Freq: Every day | ORAL | 0 refills | Status: AC

## 2018-12-31 MED ORDER — AMLODIPINE BESYLATE 5 MG PO TABS: 5.0000 mg | ORAL_TABLET | Freq: Every day | ORAL | 3 refills | Status: DC

## 2018-12-31 MED FILL — CARBAMAZEPINE ER 400 MG TAB: 400 | 30 days supply | Qty: 60 | Fill #0

## 2018-12-31 MED FILL — ACETAMINOPHEN/COD #3 TABLET: 300-30 | 10 days supply | Qty: 30 | Fill #0

## 2018-12-31 MED FILL — TAMSULOSIN HCL 0.4 MG CAP: 0.4 | 30 days supply | Qty: 30 | Fill #0

## 2018-12-31 NOTE — Progress Notes (Signed)
Virtual Visit via Telephone Note Due to national recommendations of social distancing due to COVID 19, telehealth visit is felt to be most appropriate for this patient at this time.  I discussed the limitations, risks, security and privacy concerns of performing an evaluation and management service by telephone and the availability of in person appointments. I also discussed with the patient that there may be a patient responsible charge related to this service. The patient expressed understanding and agreed to proceed.    I connected with Alex [name] on 12/31/18  at   3:30 PM EDT  EDT by telephone and verified that I am speaking with the correct person using two identifiers.   Consent I discussed the limitations, risks, security and privacy concerns of performing an evaluation and management service by telephone and the availability of in person appointments. I also discussed with the patient that there may be a patient responsible charge related to this service. The patient expressed understanding and agreed to proceed.   Location of Patient: [facility]   [facility] Provider: [facility]    Persons participating in Telemedicine visit: Alex [name] Alex [name] Alex [name]    History of Present Illness: Telemedicine visit for: Establish care He has a history of HTN and recently Trigeminal Neuralgia and nephrolithiasis.   Trigeminal Neuralgia  He was treated in the ED for trigeminal neuralgia on 12-26-2018 with tegretol and has been taking 200 mg TID with only minimal relief of pain. He has been evaluated a few times for the same symptoms and was treated for sinusitis, migraines and TMJ with only temporary relief and then symptoms would return.  He describes symptoms of lancinating pain and severe sharp stabbing pain on the left side of his face. Symptoms are currently of moderate and severe severity. Symptoms occur all day and last hours.  Symptoms are worse on the left side and include headaches (possibly related to poorly controlled HTN). He denies blurred vision. States symptoms started several months ago.    Urolithiasis: Patient complains of bilateral flank pain with radiation to the groin. Onset of symptoms was abrupt starting a few days ago with unchanged course since that time. Patient describes the pain as sharp, shooting and stabbing, intermittent, occurring about every few minutes and lasting hours. Rated as moderate, severe, intermittent and 8 / 10. The patient has had no nausea and no vomiting and no diaphoresis. There has been no fever or chills however he does have a mild fever today. The patient is not complaining of dysuria or frequency but does endorses hesitancy. There is a history of kidney stones in the past. He was referred to [facility] upon ED discharge however states he has not contacted them due to uninsured status. I have instructed him to call their office to try to set up a payment plan. This is his 7th occurrence in the last 5 years per his report. He saw a Insurance underwriter in [place] for recurrent stones but states he lost the stone he was supposed to turn in to the Urologist at that time for evaluation.    Essential Hypertension He was prescribed HCTZ 25 mg in the past however states his prescription ran out and he was lost to follow up with a PCP. Denies chest pain, shortness of breath, palpitations, lightheadedness, dizziness,  or BLE edema. He does not monitor his blood pressure at home. Will start on amlodipine 5 mg daily.  BP Readings from Last  3 Encounters:  12/30/18 (!) 162/78  12/26/18 (!) 158/103  12/08/18 (!) 148/89    Past Medical History:  Diagnosis Date  . Bone spur   . Flu   . Kidney stone     Past Surgical History:  Procedure Laterality Date  . ELBOW SURGERY  2019    Family History  Problem Relation Age of Onset  . Hypertension Mother   . Kidney disease Mother   . Heart  disease Mother   . Cancer Father     Social History   Socioeconomic History  . Marital status: Married    Spouse name: Not on file  . Number of children: Not on file  . Years of education: Not on file  . Highest education level: Not on file  Occupational History  . Not on file  Social Needs  . Financial resource strain: Not on file  . Food insecurity:    Worry: Not on file    Inability: Not on file  . Transportation needs:    Medical: Not on file    Non-medical: Not on file  Tobacco Use  . Smoking status: Current Every Day Smoker    Packs/day: 0.10    Types: Cigarettes    Last attempt to quit: 02/16/2017    Years since quitting: 1.8  . Smokeless tobacco: Never Used  Substance and Sexual Activity  . Alcohol use: No    Comment: former quit January 2018  . Drug use: No    Types: Cocaine    Comment: last use in April 2018  . Sexual activity: Yes  Lifestyle  . Physical activity:    Days per week: Not on file    Minutes per session: Not on file  . Stress: Not on file  Relationships  . Social connections:    Talks on phone: Not on file    Gets together: Not on file    Attends religious service: Not on file    Active member of club or organization: Not on file    Attends meetings of clubs or organizations: Not on file    Relationship status: Not on file  Other Topics Concern  . Not on file  Social History Narrative  . Not on file     Observations/Objective: Awake, alert and oriented x 3   Review of Systems  Constitutional: Negative for fever, malaise/fatigue and weight loss.  HENT: Negative.  Negative for nosebleeds.   Eyes: Negative.  Negative for blurred vision, double vision and photophobia.  Respiratory: Negative.  Negative for cough and shortness of breath.   Cardiovascular: Negative.  Negative for chest pain, palpitations and leg swelling.  Gastrointestinal: Negative.  Negative for heartburn, nausea and vomiting.  Genitourinary: Positive for flank pain.  Negative for dysuria, frequency, hematuria and urgency.       SEE HPI  Musculoskeletal: Negative for myalgias.  Neurological: Positive for headaches. Negative for dizziness, focal weakness and seizures.  Psychiatric/Behavioral: Negative.  Negative for suicidal ideas.    Assessment and Plan: Diagnoses and all orders for this visit:  Essential hypertension -     amLODipine (NORVASC) 5 MG tablet; Take 1 tablet (5 mg total) by mouth daily. Continue all antihypertensives as prescribed.  Remember to bring in your blood pressure log with you for your follow up appointment.  DASH/Mediterranean Diets are healthier choices for HTN.    Calculus of lower urinary tract -     tamsulosin (FLOMAX) 0.4 MG CAPS capsule; Take 1 capsule (0.4 mg total) by  mouth daily for 30 days. -     carbamazepine (TEGRETOL-XR) 400 MG 12 hr tablet; Take 1 tablet (400 mg total) by mouth 2 (two) times daily for 30 days. -     acetaminophen-codeine (TYLENOL #3) 300-30 MG tablet; Take 1-2 tablets by mouth every 8 (eight) hours as needed for moderate pain or severe pain. No additional refills  Tobacco dependence [name] was counseled on the dangers of tobacco use, and was advised to quit. Reviewed strategies to maximize success, including removing cigarettes and smoking materials from environment, stress management and support of family/friends as well as pharmacological alternatives including: Wellbutrin, Chantix, Nicotine patch, Nicotine gum or lozenges. Smoking cessation support: smoking cessation hotline: 1-800-QUIT-NOW.  Smoking cessation classes are also available through [facility]. Call [phone] or visit our website at [email or web address].   A total of minutes was spent on counseling for smoking cessation and [name] is not ready to quit.    Follow Up Instructions Return in about 4 weeks (around 01/28/2019).     I discussed the assessment and treatment plan with the patient. The patient was  provided an opportunity to ask questions and all were answered. The patient agreed with the plan and demonstrated an understanding of the instructions.   The patient was advised to call back or seek an in-person evaluation if the symptoms worsen or if the condition fails to improve as anticipated.  I provided 31 minutes of non-face-to-face time during this encounter including median intraservice time, reviewing previous notes, labs, imaging, medications and explaining diagnosis and management.  Claiborne RiggZelda W Navil Kole, [name]

## 2019-01-01 MED FILL — ?AMLODIPINE BESYLATE 5MG TA: 5 | 90 days supply | Qty: 90 | Fill #0

## 2019-01-13 ENCOUNTER — Ambulatory Visit: Payer: Self-pay | Admitting: Nurse Practitioner

## 2019-01-14 ENCOUNTER — Ambulatory Visit: Payer: Self-pay | Admitting: Pharmacist

## 2019-10-23 ENCOUNTER — Ambulatory Visit: Payer: Self-pay | Attending: Internal Medicine

## 2019-10-23 DIAGNOSIS — Z23 Encounter for immunization: Secondary | ICD-10-CM

## 2019-10-23 NOTE — Progress Notes (Signed)
   Covid-19 Vaccination Clinic  Name:  TYLIK TREESE    MRN: [id number] DOB: 08/22/1965  10/23/2019  Mr. Brandl was observed post Covid-19 immunization for 15 minutes without incident. He was provided with Vaccine Information Sheet and instruction to access the V-Safe system.   Mr. Trueheart was instructed to call 911 with any severe reactions post vaccine: Marland Kitchen Difficulty breathing  . Swelling of face and throat  . A fast heartbeat  . A bad rash all over body  . Dizziness and weakness   Immunizations Administered    Name Date Dose VIS Date Route   Pfizer COVID-19 Vaccine 10/23/2019 11:45 AM 0.3 mL 07/18/2019 Intramuscular   Manufacturer: [employer], [employer]   Lot: [id number]   NDC: [address]-6

## 2019-11-17 ENCOUNTER — Ambulatory Visit: Payer: Self-pay | Attending: Internal Medicine

## 2019-11-17 DIAGNOSIS — Z23 Encounter for immunization: Secondary | ICD-10-CM

## 2019-11-17 NOTE — Progress Notes (Signed)
   Covid-19 Vaccination Clinic  Name:  Alex [name]    MRN: [id number] DOB: [birth year]  11/17/2019  Mr. Amory was observed post Covid-19 immunization for 15 minutes without incident. He was provided with Vaccine Information Sheet and instruction to access the V-Safe system.   Mr. Kanaan was instructed to call 911 with any severe reactions post vaccine: Marland Kitchen Difficulty breathing  . Swelling of face and throat  . A fast heartbeat  . A bad rash all over body  . Dizziness and weakness   Immunizations Administered    Name Date Dose VIS Date Route   Pfizer COVID-19 Vaccine 11/17/2019  1:40 PM 0.3 mL 07/18/2019 Intramuscular   Manufacturer: [employer], [employer]   Lot: [id number]   NDC: [address]-9

## 2020-03-17 ENCOUNTER — Other Ambulatory Visit: Payer: Self-pay | Admitting: Family Medicine

## 2020-03-17 ENCOUNTER — Ambulatory Visit: Payer: Self-pay | Attending: Family Medicine | Admitting: Family Medicine

## 2020-03-17 ENCOUNTER — Encounter: Payer: Self-pay | Admitting: Family Medicine

## 2020-03-17 ENCOUNTER — Other Ambulatory Visit: Payer: Self-pay

## 2020-03-17 VITALS — BP 133/75 | HR 68 | Ht 75.0 in | Wt 309.0 lb

## 2020-03-17 DIAGNOSIS — R600 Localized edema: Secondary | ICD-10-CM

## 2020-03-17 DIAGNOSIS — I10 Essential (primary) hypertension: Secondary | ICD-10-CM

## 2020-03-17 MED ORDER — HYDROCHLOROTHIAZIDE 25 MG PO TABS: 25.0000 mg | ORAL_TABLET | Freq: Every day | ORAL | 6 refills | Status: DC

## 2020-03-17 MED FILL — HYDROCHLOROTHIAZIDE 25 MG T: 25 | 30 days supply | Qty: 30 | Fill #0

## 2020-03-17 NOTE — Patient Instructions (Signed)
Edema  Edema is when you have too much fluid in your body or under your skin. Edema may make your legs, feet, and ankles swell up. Swelling is also common in looser tissues, like around your eyes. This is a common condition. It gets more common as you get older. There are many possible causes of edema. Eating too much salt (sodium) and being on your feet or sitting for a long time can cause edema in your legs, feet, and ankles. Hot weather may make edema worse. Edema is usually painless. Your skin may look swollen or shiny. Follow these instructions at home:  Keep the swollen body part raised (elevated) above the level of your heart when you are sitting or lying down.  Do not sit still or stand for a long time.  Do not wear tight clothes. Do not wear garters on your upper legs.  Exercise your legs. This can help the swelling go down.  Wear elastic bandages or support stockings as told by your doctor.  Eat a low-salt (low-sodium) diet to reduce fluid as told by your doctor.  Depending on the cause of your swelling, you may need to limit how much fluid you drink (fluid restriction).  Take over-the-counter and prescription medicines only as told by your doctor. Contact a doctor if:  Treatment is not working.  You have heart, liver, or kidney disease and have symptoms of edema.  You have sudden and unexplained weight gain. Get help right away if:  You have shortness of breath or chest pain.  You cannot breathe when you lie down.  You have pain, redness, or warmth in the swollen areas.  You have heart, liver, or kidney disease and get edema all of a sudden.  You have a fever and your symptoms get worse all of a sudden. Summary  Edema is when you have too much fluid in your body or under your skin.  Edema may make your legs, feet, and ankles swell up. Swelling is also common in looser tissues, like around your eyes.  Raise (elevate) the swollen body part above the level of your  heart when you are sitting or lying down.  Follow your doctor's instructions about diet and how much fluid you can drink (fluid restriction). This information is not intended to replace advice given to you by your health care provider. Make sure you discuss any questions you have with your health care provider. Document Revised: 07/27/2017 Document Reviewed: 08/11/2016 Elsevier Patient Education  2020 Elsevier Inc.  

## 2020-03-17 NOTE — Progress Notes (Signed)
States that he is having pain in legs, states that its fluid.  States that he has been out of his fluid medication.

## 2020-03-17 NOTE — Progress Notes (Signed)
Subjective:  Patient ID: Alex [name], male    DOB: 08-May-1966  Age: 54 y.o. MRN: [id number]  CC: Hypertension   HPI Alex [name] is a 54 year old male patient of [facility] [place], [facility] presents today with complaints of pedal edema.  He is currently on amlodipine for hypertension Pedal edema has been ongoing for the last 3 weeks; he stands on his feet at work for up to 6-8 hrs as he works at an [employer]/rest[employer] He has not been taking his Amlodipine as he has been out for the last 8-9 months.  Endorses a high sodium intake. Past Medical History:  Diagnosis Date   Bone spur    Flu    Kidney stone     Past Surgical History:  Procedure Laterality Date   ELBOW SURGERY  2019    Family History  Problem Relation Age of Onset   Hypertension Mother    Kidney disease Mother    Heart disease Mother    Cancer Father     No Known Allergies  Outpatient Medications Prior to Visit  Medication Sig Dispense Refill   acetaminophen-codeine (TYLENOL #3) 300-30 MG tablet Take 1-2 tablets by mouth every 8 (eight) hours as needed for moderate pain or severe pain. No additional refills (Patient not taking: Reported on 03/17/2020) 30 tablet 0   carbamazepine (TEGRETOL-XR) 400 MG 12 hr tablet Take 1 tablet (400 mg total) by mouth 2 (two) times daily for 30 days. 60 tablet 0   oxyCODONE-acetaminophen (PERCOCET) 5-325 MG tablet Take 1 tablet by mouth every 4 (four) hours as needed for moderate pain. (Patient not taking: Reported on 03/17/2020) 10 tablet 0   amLODipine (NORVASC) 5 MG tablet Take 1 tablet (5 mg total) by mouth daily. (Patient not taking: Reported on 03/17/2020) 90 tablet 3   No facility-administered medications prior to visit.     ROS Review of Systems  Constitutional: Negative for activity change and appetite change.  HENT: Negative for sinus pressure and sore throat.   Eyes: Negative for visual disturbance.  Respiratory: Negative for cough, chest tightness and  shortness of breath.   Cardiovascular: Positive for leg swelling. Negative for chest pain.  Gastrointestinal: Negative for abdominal distention, abdominal pain, constipation and diarrhea.  Endocrine: Negative.   Genitourinary: Negative for dysuria.  Musculoskeletal: Negative for joint swelling and myalgias.  Skin: Negative for rash.  Allergic/Immunologic: Negative.   Neurological: Negative for weakness, light-headedness and numbness.  Psychiatric/Behavioral: Negative for dysphoric mood and suicidal ideas.    Objective:  BP 133/75    Pulse 68    Ht 6\' 3"  (1.905 m)    Wt (!) 309 lb (140.2 kg)    SpO2 98%    BMI 38.62 kg/m   BP/Weight 03/17/2020 12/30/2018 12/26/2018  Systolic BP 133 162 158  Diastolic BP 75 78 103  Wt. (Lbs) 309 - -  BMI 38.62 - -      Physical Exam Constitutional:      Appearance: He is well-developed.  Neck:     Vascular: No JVD.  Cardiovascular:     Rate and Rhythm: Normal rate.     Heart sounds: Normal heart sounds. No murmur heard.   Pulmonary:     Effort: Pulmonary effort is normal.     Breath sounds: Normal breath sounds. No wheezing or rales.  Chest:     Chest wall: No tenderness.  Abdominal:     General: Bowel sounds are normal. There is no distension.  Palpations: Abdomen is soft. There is no mass.     Tenderness: There is no abdominal tenderness.  Musculoskeletal:        General: Normal range of motion.     Right lower leg: Edema (1+) present.     Left lower leg: Edema (1+) present.  Neurological:     Mental Status: He is alert and oriented to person, place, and time.  Psychiatric:        Mood and Affect: Mood normal.     CMP Latest Ref Rng & Units 12/30/2018 06/23/2018 07/13/2017  Glucose 70 - 99 mg/dL 633(H) 545(G) 98  BUN 6 - 20 mg/dL 13 8 17   Creatinine 0.61 - 1.24 mg/dL 2.56 3.89  Sodium 135 - 145 mmol/L 138 139 142  Potassium 3.5 - 5.1 mmol/L 4.7 4.0 4.4  Chloride 98 - 111 mmol/L 105 108 102  CO2 22 - 32 mmol/L 25 22 26     Calcium 8.9 - 10.3 mg/dL 3.73) 9.0 9.4  Total Protein 6.1 - 8.1 g/dL - - -  Total Bilirubin 0.2 - 1.2 mg/dL - - -  Alkaline Phos 38 - 126 U/L - - -  AST 10 - 35 U/L - - -  ALT 9 - 46 U/L - - -    Lipid Panel     Component Value Date/Time   CHOL 163 04/11/2017 1103   TRIG 129 04/11/2017 1103   HDL 38 (L) 04/11/2017 1103   CHOLHDL 4.3 04/11/2017 1103   LDLCALC 102 (H) 04/11/2017 1103    CBC    Component Value Date/Time   WBC 7.9 12/30/2018 0354   RBC 5.29 12/30/2018 0354   HGB 15.6 12/30/2018 0354   HCT 47.2 12/30/2018 0354   PLT 255 12/30/2018 0354   MCV 89.2 12/30/2018 0354   MCH 29.5 12/30/2018 0354   MCHC 33.1 12/30/2018 0354   RDW 13.2 12/30/2018 0354   LYMPHSABS 3.0 12/30/2018 0354   MONOABS 0.8 12/30/2018 0354   EOSABS 0.1 12/30/2018 0354   BASOSABS 0.1 12/30/2018 0354    Lab Results  Component Value Date   HGBA1C 6.1 07/13/2017    Assessment & Plan:  1. Essential hypertension Controlled Discontinue amlodipine due to pedal edema Counseled on blood pressure goal of less than 130/80, low-sodium, DASH diet, medication compliance, 150 minutes of moderate intensity exercise per week. Discussed medication compliance, adverse effects. - Basic Metabolic Panel - hydrochlorothiazide (HYDRODIURIL) 25 MG tablet; Take 1 tablet (25 mg total) by mouth daily.  Dispense: 30 tablet; Refill: 6  2. Pedal edema See #1 above Advised to reduce sodium intake, elevate feet Compression stockings will be beneficial - hydrochlorothiazide (HYDRODIURIL) 25 MG tablet; Take 1 tablet (25 mg total) by mouth daily.  Dispense: 30 tablet; Refill: 6   Meds ordered this encounter  Medications   hydrochlorothiazide (HYDRODIURIL) 25 MG tablet    Sig: Take 1 tablet (25 mg total) by mouth daily.    Dispense:  30 tablet    Refill:  6    Follow-up: Return in about 3 months (around 06/17/2020) for Chronic disease management.       14/02/2017, MD, FAAFP. [facility] [place], [facility] [place]   03/17/2020, 9:49 AM

## 2020-03-18 LAB — BASIC METABOLIC PANEL
BUN/Creatinine Ratio: 14 (ref 9–20)
BUN: 12 mg/dL (ref 6–24)
CO2: 25 mmol/L (ref 20–29)
Calcium: 9.1 mg/dL (ref 8.7–10.2)
Chloride: 102 mmol/L (ref 96–106)
Creatinine, Ser: 0.85 mg/dL (ref 0.76–1.27)
GFR calc Af Amer: 113 mL/min/{1.73_m2} (ref >59)
GFR calc non Af Amer: 97 mL/min/{1.73_m2} (ref >59)
Glucose: 95 mg/dL (ref 65–99)
Potassium: 4.9 mmol/L (ref 3.5–5.2)
Sodium: 140 mmol/L (ref 134–144)

## 2020-03-19 ENCOUNTER — Telehealth: Payer: Self-pay

## 2020-03-19 NOTE — Telephone Encounter (Signed)
Patient was called and a message was left informing patient to call office.

## 2020-03-19 NOTE — Telephone Encounter (Signed)
Pt called states very happy with lab results & symptoms have greatly improved.

## 2020-03-19 NOTE — Progress Notes (Signed)
Patient was given lab results via by front desk staff. Okayed by CMA.

## 2020-03-19 NOTE — Telephone Encounter (Signed)
-----   Message from Hoy Register, MD sent at 03/18/2020  4:18 PM EDT ----- Please inform the patient that labs are normal. Thank you.

## 2020-04-15 MED FILL — HYDROCHLOROTHIAZIDE 25 MG T: 25 | 30 days supply | Qty: 30 | Fill #1

## 2020-05-17 IMAGING — DX DG ELBOW COMPLETE 3+V*R*
4 series · 4 of 4 positions shown · non-contrast
Comparison: None.

CLINICAL DATA: Pain.

EXAM:
RIGHT ELBOW - COMPLETE 3+ VIEW

[elbow ap]
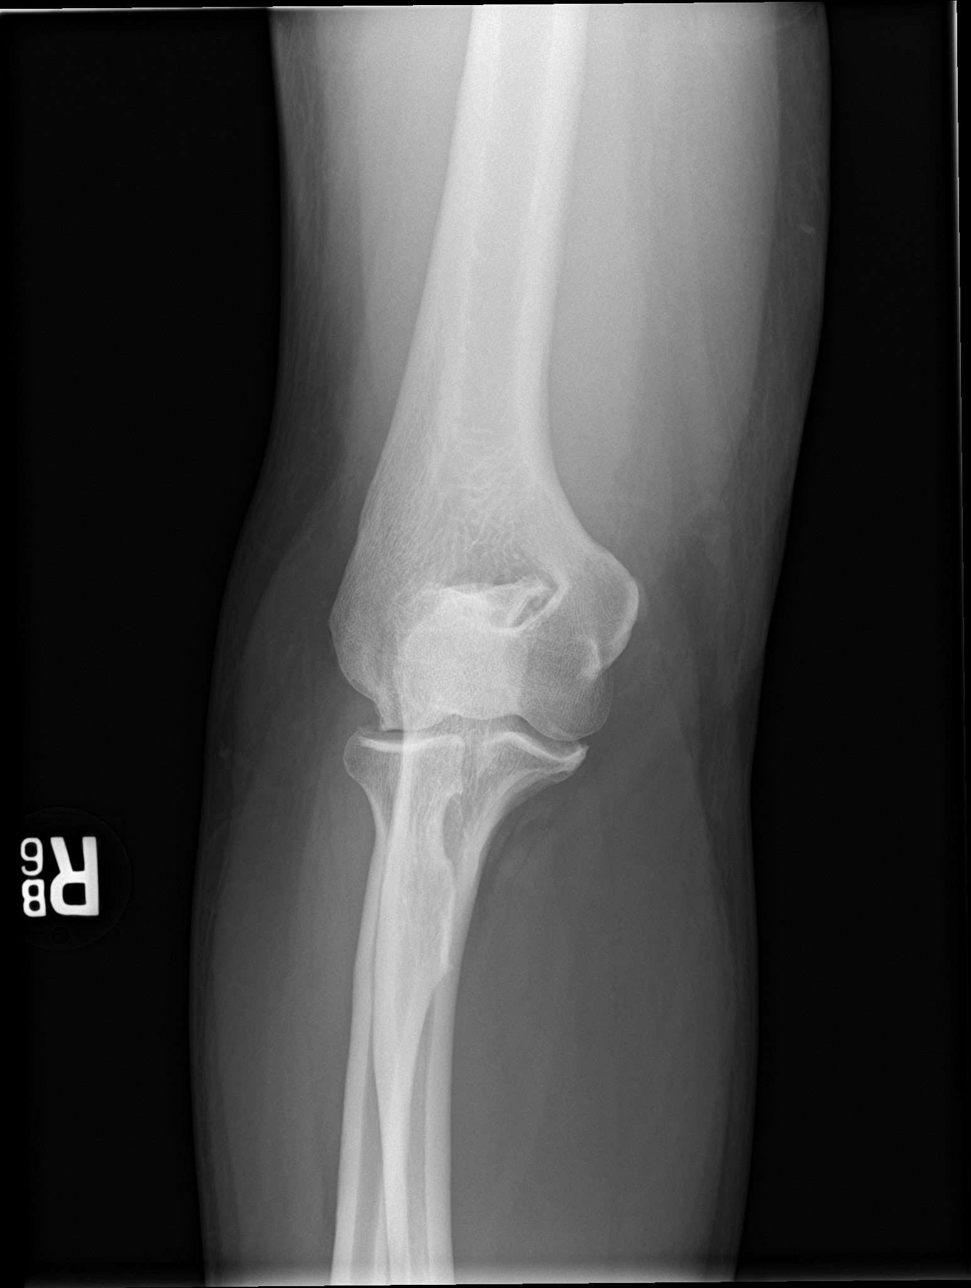

[elbow obl (1 of 2)]
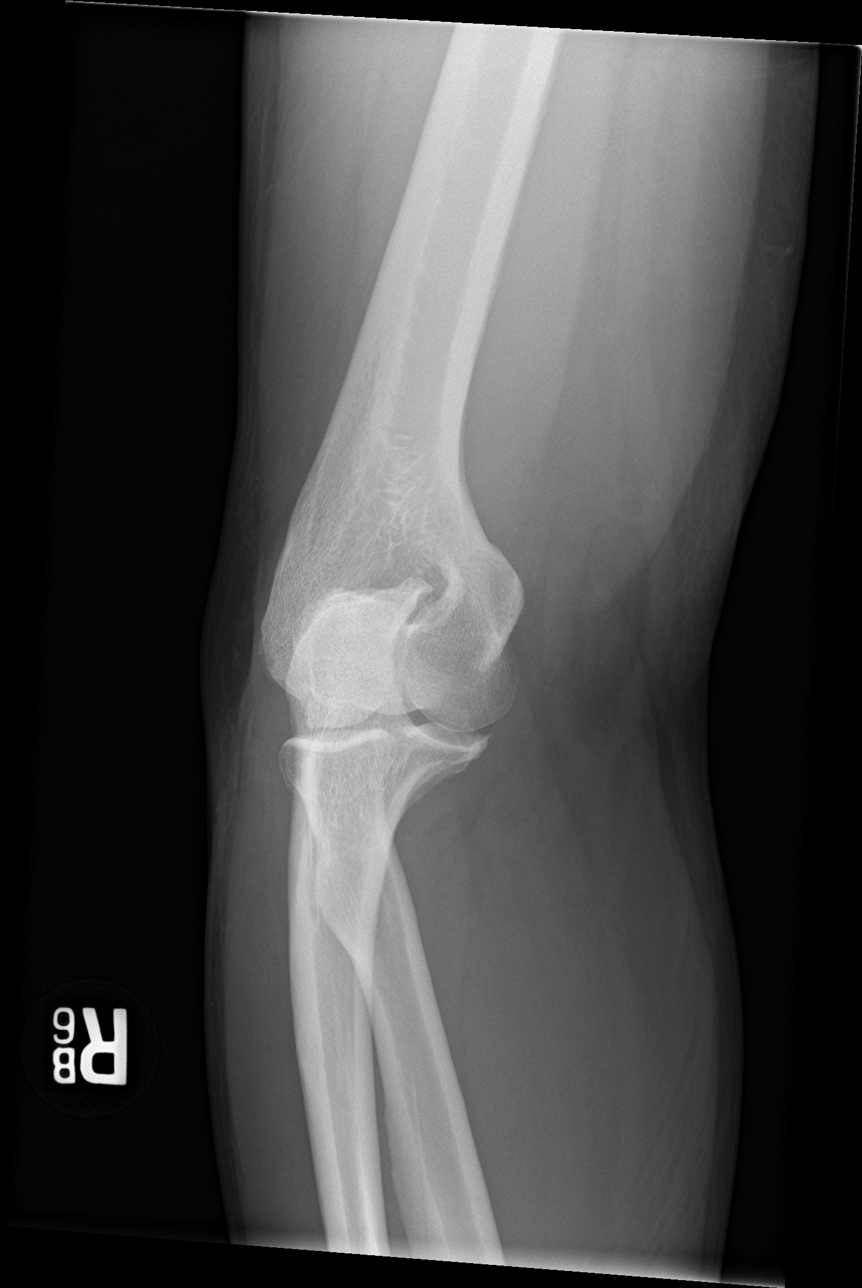

[elbow obl (2 of 2)]
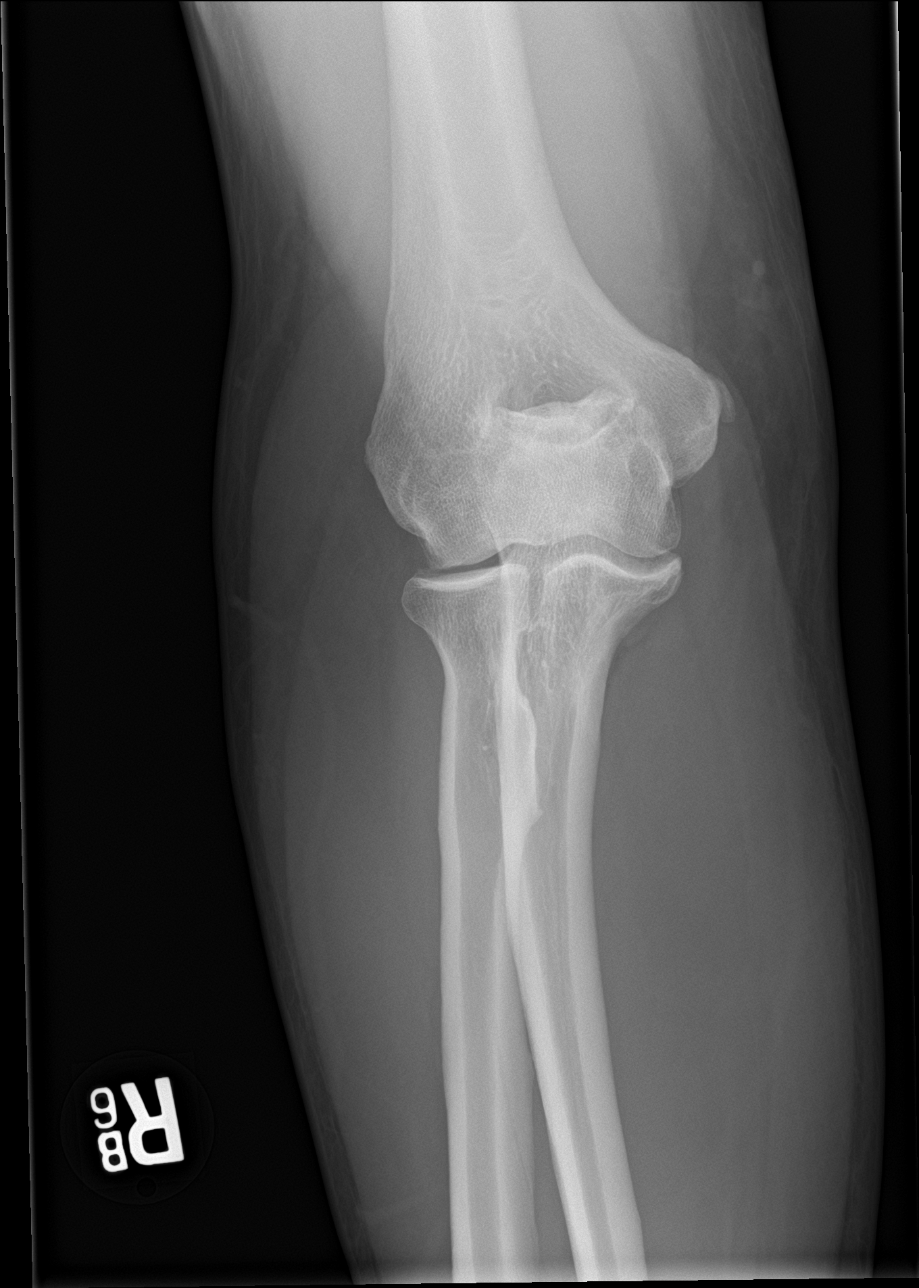

[elbow lat]
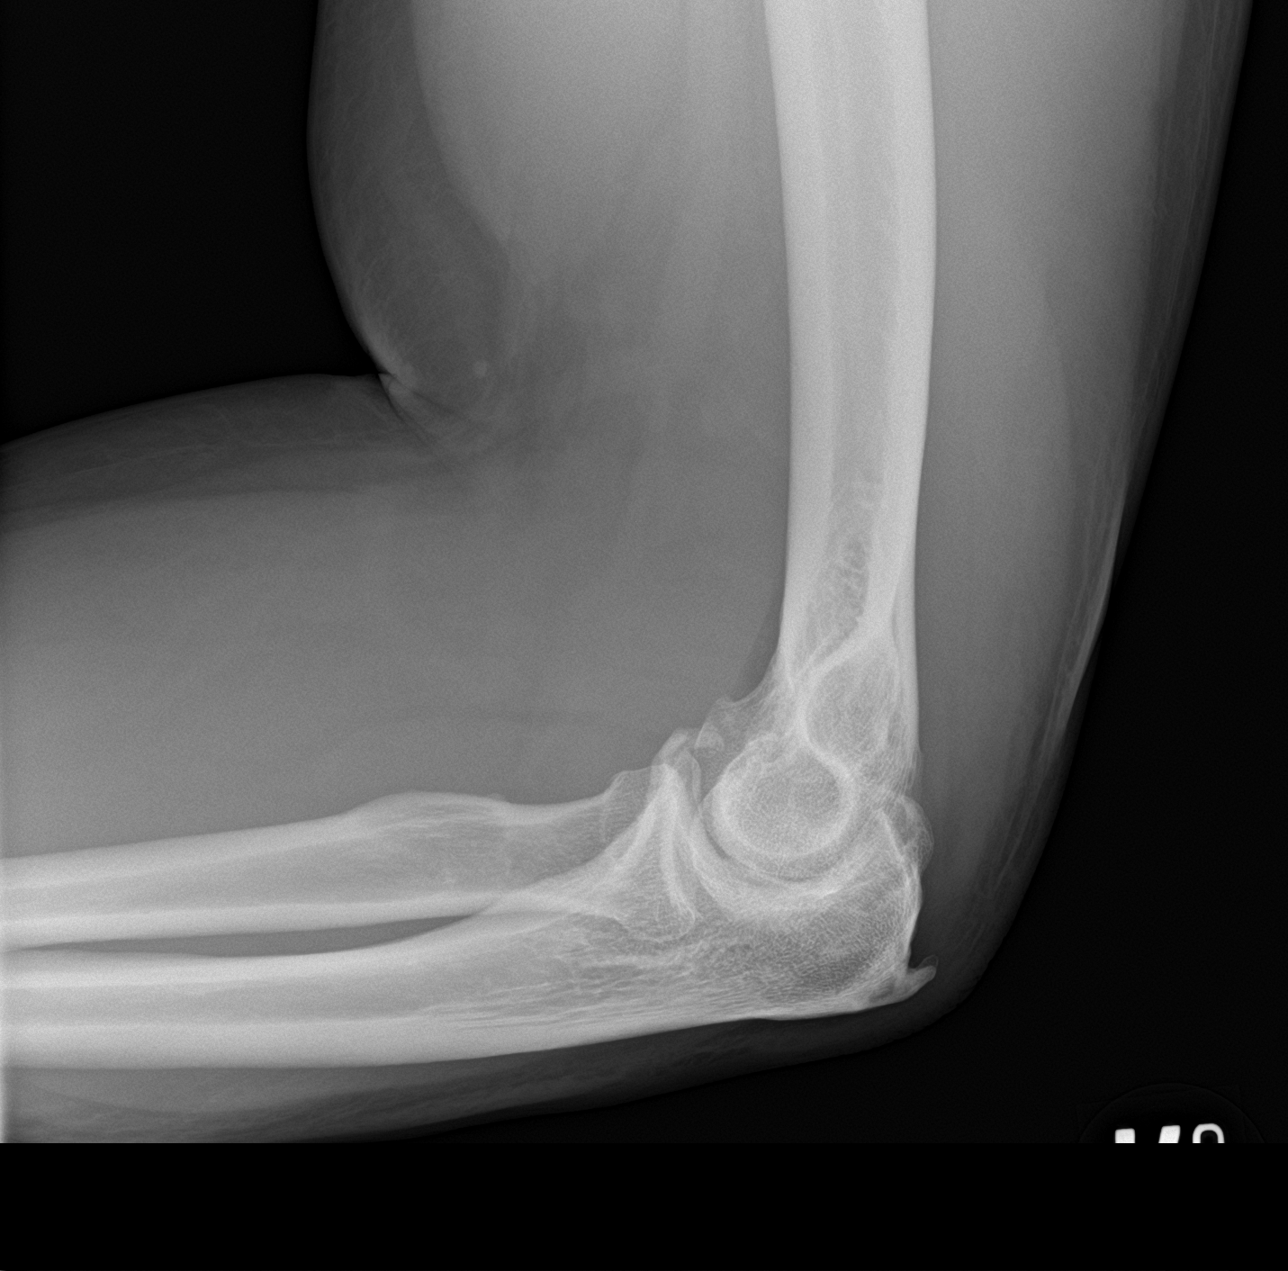

[4 of 4 positions shown; findings below may reference images not displayed]

FINDINGS: No joint effusion. Enthesopathic changes at the proximal ulna.
Degenerative changes in the elbow. No acute fracture.
IMPRESSION: Degenerative changes.  No fracture or effusion.

## 2020-05-17 MED FILL — HYDROCHLOROTHIAZIDE 25 MG T: 25 | 30 days supply | Qty: 30 | Fill #2

## 2020-06-15 ENCOUNTER — Other Ambulatory Visit: Payer: Self-pay

## 2020-06-15 ENCOUNTER — Ambulatory Visit: Payer: Self-pay | Attending: Nurse Practitioner | Admitting: Nurse Practitioner

## 2020-06-15 ENCOUNTER — Encounter: Payer: Self-pay | Admitting: Nurse Practitioner

## 2020-06-15 ENCOUNTER — Other Ambulatory Visit: Payer: Self-pay | Admitting: Nurse Practitioner

## 2020-06-15 VITALS — BP 125/76 | HR 69 | Temp 97.7°F | Ht 75.0 in | Wt 293.4 lb

## 2020-06-15 DIAGNOSIS — Z130 Encounter for screening for diseases of the blood and blood-forming organs and certain disorders involving the immune mechanism: Secondary | ICD-10-CM

## 2020-06-15 DIAGNOSIS — Z125 Encounter for screening for malignant neoplasm of prostate: Secondary | ICD-10-CM

## 2020-06-15 DIAGNOSIS — J320 Chronic maxillary sinusitis: Secondary | ICD-10-CM

## 2020-06-15 DIAGNOSIS — M779 Enthesopathy, unspecified: Secondary | ICD-10-CM

## 2020-06-15 DIAGNOSIS — R221 Localized swelling, mass and lump, neck: Secondary | ICD-10-CM

## 2020-06-15 DIAGNOSIS — E785 Hyperlipidemia, unspecified: Secondary | ICD-10-CM

## 2020-06-15 DIAGNOSIS — H0520 Unspecified exophthalmos: Secondary | ICD-10-CM

## 2020-06-15 DIAGNOSIS — R7303 Prediabetes: Secondary | ICD-10-CM

## 2020-06-15 DIAGNOSIS — Z1211 Encounter for screening for malignant neoplasm of colon: Secondary | ICD-10-CM

## 2020-06-15 DIAGNOSIS — Z114 Encounter for screening for human immunodeficiency virus [HIV]: Secondary | ICD-10-CM

## 2020-06-15 DIAGNOSIS — I10 Essential (primary) hypertension: Secondary | ICD-10-CM

## 2020-06-15 DIAGNOSIS — Z1159 Encounter for screening for other viral diseases: Secondary | ICD-10-CM

## 2020-06-15 LAB — POCT GLYCOSYLATED HEMOGLOBIN (HGB A1C): Hemoglobin A1C: 6.2 % — AB (ref 4.0–5.6)

## 2020-06-15 LAB — GLUCOSE, POCT (MANUAL RESULT ENTRY): POC Glucose: 122 mg/dl — AB (ref 70–99)

## 2020-06-15 MED ORDER — FLUTICASONE PROPIONATE 50 MCG/ACT NA SUSP: 2.0000 | Freq: Every day | NASAL | 6 refills | Status: DC

## 2020-06-15 MED ORDER — AMOXICILLIN-POT CLAVULANATE 875-125 MG PO TABS: 1.0000 | ORAL_TABLET | Freq: Two times a day (BID) | ORAL | 0 refills | Status: DC

## 2020-06-15 MED FILL — FLUTICASONE PROP 50 MCG SPR: 50 | 30 days supply | Qty: 16 | Fill #0

## 2020-06-15 MED FILL — HYDROCHLOROTHIAZIDE 25 MG T: 25 | 30 days supply | Qty: 30 | Fill #3

## 2020-06-15 MED FILL — AMOX-CLAV 875-125 MG TABLET: 875-125 | 7 days supply | Qty: 14 | Fill #0

## 2020-06-15 NOTE — Progress Notes (Signed)
Assessment & Plan:  Alex [name] was seen today for sinus problem.  Diagnoses and all orders for this visit:  Essential hypertension -     CMP14+EGFR Continue all antihypertensives as prescribed.  Remember to bring in your blood pressure log with you for your follow up appointment.  DASH/Mediterranean Diets are healthier choices for HTN.    Prediabetes -     Glucose (CBG) -     HgB A1c Continue blood sugar control as discussed in office today, low carbohydrate diet, and regular physical exercise as tolerated, 150 minutes per week (30 min each day, 5 days per week, or 50 min 3 days per week).    Prostate cancer screening -     PSA  Dyslipidemia, goal LDL below 100 -     Lipid panel  Exophthalmos -     Thyroid Panel With TSH  Neck nodule -     US THYROID; Future  Bone spur -     DG Lumbar Spine Complete; Future  Screening for deficiency anemia -     CBC  Colon cancer screening -     Fecal occult blood, imunochemical([facility])  Need for hepatitis C screening test -     HCV Ab w Reflex to Quant PCR  Encounter for screening for HIV -     HIV antibody (with reflex)  Chronic maxillary sinusitis -     amoxicillin-clavulanate (AUGMENTIN) 875-125 MG tablet; Take 1 tablet by mouth 2 (two) times daily for 7 days. -     fluticasone (FLONASE) 50 MCG/ACT nasal spray; Place 2 sprays into both nostrils daily.    Patient has been counseled on age-appropriate routine health concerns for screening and prevention. These are reviewed and up-to-date. Referrals have been placed accordingly. Immunizations are up-to-date or declined.    Subjective:   Chief Complaint  Patient presents with   Sinus Problem    Pt. stated he had a cold and now he have a sinus infection. Pt. would like some medication to help his sinus pressure.     HPI Alex [name] 54 y.o. male presents to office today for follow up. 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  He has not been taking tegretol since last year. Denies any severe headaches or facial pain.   BLE Edema Has taken prn lasix in the past. Currently denies any BLE edema. Taking HCTZ 25 mg daily for his HTN.   Migraines CT 12/08/2018 showed no acute abnormalities.  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  He has tried Fioricet in the past for his headaches.  Essential Hypertension Blood pressure is well controlled today.  He takes hydrochlorothiazide 25 mg daily as prescribed.  Denies chest pain, shortness of breath, palpitations, lightheadedness, dizziness, headaches or BLE edema.  BP Readings from Last 3 Encounters:  06/15/20 125/76  03/17/20 133/75  12/30/18 (!) 162/78    Sinusitis: Patient presents with recurrent sinusitis. Onset  a few weeks.  His symptoms include nasal congestion, purulent rhinorrhea, headaches, puffiness of the eyes.  There has not been a history of fevers, periorbital venous congestion, posttussive emesis, spitting/vomiting mucous. There has not  been a history of chronic otitis media or pharyngotonsillitis.  Prior antibiotic therapy has included Augmentin. Other medications have included Flonase.  He has not had allergy testing in the past.  He does have a history of sinusitis.   Low back Pain Was told in the past he had bone spurs in his lower back. States he can feel them on the left side of his spine. Low back pain worsens when he is sitting in a low car, with prolonged sitting or turns the wrong way. Associated symptoms: numbness radiating down left leg.     Review of Systems    Constitutional: Negative for fever, malaise/fatigue and weight loss.  HENT: Positive for congestion and sinus pain. Negative for nosebleeds.   Eyes: Negative.  Negative for blurred vision, double vision and photophobia.  Respiratory: Negative.  Negative for cough, shortness of breath and wheezing.   Cardiovascular: Negative.  Negative for chest pain, palpitations and leg swelling.  Gastrointestinal: Negative.  Negative for heartburn, nausea and vomiting.  Musculoskeletal: Positive for back pain. Negative for falls and myalgias.  Neurological: Negative.  Negative for dizziness, focal weakness, seizures and headaches.  Psychiatric/Behavioral: Negative.  Negative for suicidal ideas.    Past Medical History:  Diagnosis Date   Bone spur    Flu    Hypertension    Kidney stone    Prediabetes     Past Surgical History:  Procedure Laterality Date   ELBOW SURGERY  2019    Family History  Problem Relation Age of Onset   Hypertension Mother    Kidney disease Mother    Heart disease Mother    Cancer Father     Social History Reviewed with no changes to be made today.   Outpatient Medications Prior to Visit  Medication Sig Dispense Refill   hydrochlorothiazide (HYDRODIURIL) 25 MG tablet Take 1 tablet (25 mg total) by mouth daily. 30 tablet 6   acetaminophen-codeine (TYLENOL #3) 300-30 MG tablet Take 1-2 tablets by mouth every 8 (eight) hours as needed for moderate pain or severe pain. No additional refills (Patient not taking: Reported on 03/17/2020) 30 tablet 0   carbamazepine (TEGRETOL-XR) 400 MG 12 hr tablet Take 1 tablet (400 mg total) by mouth 2 (two) times daily for 30 days. 60 tablet 0   oxyCODONE-acetaminophen (PERCOCET) 5-325 MG tablet Take 1 tablet by mouth every 4 (four) hours as needed for moderate pain. (Patient not taking: Reported on 03/17/2020) 10 tablet 0   No facility-administered medications prior to visit.    No Known Allergies     Objective:     BP 125/76 (BP Location: Left Arm, Patient Position: Sitting, Cuff Size: Normal)    Pulse 69    Temp 97.7 F (36.5 C) (Temporal)    Ht '6\' 3"'  (1.905 m)    Wt 293 lb 6.4 oz (133.1 kg)    SpO2 98%    BMI 36.67 kg/m  Wt Readings from Last 3 Encounters:  06/15/20 293 lb 6.4 oz (133.1 kg)  03/17/20 (!) 309 lb (140.2 kg)  12/08/18 (!) 303 lb (137.4 kg)    Physical Exam Vitals and nursing note reviewed.  Constitutional:      Appearance: He is well-developed.  HENT:     Head: Normocephalic and atraumatic.     Right Ear: Hearing, ear canal and external ear normal. A middle ear effusion is present.     Left Ear: Hearing, ear canal and external ear normal. A middle ear effusion is present.  Nose: Mucosal edema present.     Right Turbinates: Enlarged (erythematous) and swollen.     Left Turbinates: Enlarged. Not swollen.     Right Sinus: Maxillary sinus tenderness present.     Left Sinus: Maxillary sinus tenderness present.  Eyes:     Comments: exophthalmus   Cardiovascular:     Rate and Rhythm: Normal rate and regular rhythm.     Heart sounds: Normal heart sounds. No murmur heard.  No friction rub. No gallop.   Pulmonary:     Effort: Pulmonary effort is normal. No tachypnea or respiratory distress.     Breath sounds: Normal breath sounds. No decreased breath sounds, wheezing, rhonchi or rales.  Chest:     Chest wall: No tenderness.  Abdominal:     General: Bowel sounds are normal.     Palpations: Abdomen is soft.  Musculoskeletal:        General: Normal range of motion.     Cervical back: Normal range of motion.  Skin:    General: Skin is warm and dry.  Neurological:     Mental Status: He is alert and oriented to person, place, and time.     Coordination: Coordination normal.  Psychiatric:        Behavior: Behavior normal. Behavior is cooperative.        Thought Content: Thought content normal.        Judgment: Judgment normal.          Patient has been counseled  extensively about nutrition and exercise as well as the importance of adherence with medications and regular follow-up. The patient was given clear instructions to go to ER or return to medical center if symptoms don't improve, worsen or new problems develop. The patient verbalized understanding.   Follow-up: Return in about 3 months (around 09/15/2020).   Gildardo Pounds, FNP-BC [facility] [place] [place]   06/15/2020, 1:00 PM

## 2020-06-17 LAB — CMP14+EGFR
ALT: 30 IU/L (ref 0–44)
AST: 24 IU/L (ref 0–40)
Albumin/Globulin Ratio: 1.7 (ref 1.2–2.2)
Albumin: 4.3 g/dL (ref 3.8–4.9)
Alkaline Phosphatase: 80 IU/L (ref 44–121)
BUN/Creatinine Ratio: 10 (ref 9–20)
BUN: 9 mg/dL (ref 6–24)
Bilirubin Total: 0.2 mg/dL (ref 0.0–1.2)
CO2: 24 mmol/L (ref 20–29)
Calcium: 9.5 mg/dL (ref 8.7–10.2)
Chloride: 104 mmol/L (ref 96–106)
Creatinine, Ser: 0.93 mg/dL (ref 0.76–1.27)
GFR calc Af Amer: 106 mL/min/{1.73_m2} (ref >59)
GFR calc non Af Amer: 91 mL/min/{1.73_m2} (ref >59)
Globulin, Total: 2.5 g/dL (ref 1.5–4.5)
Glucose: 107 mg/dL — ABNORMAL HIGH (ref 65–99)
Potassium: 4.9 mmol/L (ref 3.5–5.2)
Sodium: 141 mmol/L (ref 134–144)
Total Protein: 6.8 g/dL (ref 6.0–8.5)

## 2020-06-17 LAB — CBC
Hematocrit: 45.1 % (ref 37.5–51.0)
Hemoglobin: 15.1 g/dL (ref 13.0–17.7)
MCH: 29.3 pg (ref 26.6–33.0)
MCHC: 33.5 g/dL (ref 31.5–35.7)
MCV: 88 fL (ref 79–97)
Platelets: 311 10*3/uL (ref 150–450)
RBC: 5.15 x10E6/uL (ref 4.14–5.80)
RDW: 12.6 % (ref 11.6–15.4)
WBC: 6.3 10*3/uL (ref 3.4–10.8)

## 2020-06-17 LAB — LIPID PANEL
Chol/HDL Ratio: 5.3 ratio — ABNORMAL HIGH (ref 0.0–5.0)
Cholesterol, Total: 184 mg/dL (ref 100–199)
HDL: 35 mg/dL — ABNORMAL LOW (ref >39)
LDL Chol Calc (NIH): 131 mg/dL — ABNORMAL HIGH (ref 0–99)
Triglycerides: 98 mg/dL (ref 0–149)
VLDL Cholesterol Cal: 18 mg/dL (ref 5–40)

## 2020-06-17 LAB — THYROID PANEL WITH TSH
Free Thyroxine Index: 2 (ref 1.2–4.9)
T3 Uptake Ratio: 31 % (ref 24–39)
T4, Total: 6.3 ug/dL (ref 4.5–12.0)
TSH: 0.738 u[IU]/mL (ref 0.450–4.500)

## 2020-06-17 LAB — PSA: Prostate Specific Ag, Serum: 0.7 ng/mL (ref 0.0–4.0)

## 2020-06-17 LAB — HCV RNA QUANT RFLX ULTRA OR GENOTYP: HCV Quant Baseline: NOT DETECTED IU/mL

## 2020-06-17 LAB — HIV ANTIBODY (ROUTINE TESTING W REFLEX): HIV Screen 4th Generation wRfx: NONREACTIVE

## 2020-06-18 LAB — FECAL OCCULT BLOOD, IMMUNOCHEMICAL: Fecal Occult Bld: NEGATIVE

## 2020-06-21 ENCOUNTER — Ambulatory Visit (HOSPITAL_COMMUNITY): Payer: Self-pay | Attending: Nurse Practitioner

## 2020-09-15 ENCOUNTER — Other Ambulatory Visit: Payer: Self-pay | Admitting: Nurse Practitioner

## 2020-09-15 ENCOUNTER — Ambulatory Visit: Payer: Self-pay | Attending: Nurse Practitioner | Admitting: Nurse Practitioner

## 2020-09-15 ENCOUNTER — Other Ambulatory Visit: Payer: Self-pay

## 2020-09-15 ENCOUNTER — Encounter: Payer: Self-pay | Admitting: Nurse Practitioner

## 2020-09-15 DIAGNOSIS — I10 Essential (primary) hypertension: Secondary | ICD-10-CM

## 2020-09-15 DIAGNOSIS — R600 Localized edema: Secondary | ICD-10-CM

## 2020-09-15 MED ORDER — HYDROCHLOROTHIAZIDE 25 MG PO TABS: 25.0000 mg | ORAL_TABLET | Freq: Every day | ORAL | 6 refills | Status: DC

## 2020-09-15 MED ORDER — FUROSEMIDE 20 MG PO TABS: 20.0000 mg | ORAL_TABLET | Freq: Every day | ORAL | 3 refills | Status: DC

## 2020-09-15 MED FILL — HYDROCHLOROTHIAZIDE 25 MG T: 25 | 30 days supply | Qty: 30 | Fill #0

## 2020-09-15 MED FILL — ?FUROSEMIDE 20MG TABLET: 20 | 30 days supply | Qty: 30 | Fill #0

## 2020-09-15 NOTE — Progress Notes (Signed)
Virtual Visit via Telephone Note Due to national recommendations of social distancing due to COVID 19, telehealth visit is felt to be most appropriate for this patient at this time.  I discussed the limitations, risks, security and privacy concerns of performing an evaluation and management service by telephone and the availability of in person appointments. I also discussed with the patient that there may be a patient responsible charge related to this service. The patient expressed understanding and agreed to proceed.    I connected with Leandrew Koyanagi on 09/15/20  at   8:30 AM EST  EDT by telephone and verified that I am speaking with the correct person using two identifiers.   Consent I discussed the limitations, risks, security and privacy concerns of performing an evaluation and management service by telephone and the availability of in person appointments. I also discussed with the patient that there may be a patient responsible charge related to this service. The patient expressed understanding and agreed to proceed.   Location of Patient: Private Residence   Location of Provider: [facility] [employer] [facility]    Persons participating in Telemedicine visit: Bertram Denver FNP-BC YY [place] CMA Leandrew Koyanagi    History of Present Illness: Telemedicine visit for: Follow Up  Essential Hypertension Taking hydrochlorothiazide 25 mg daily.  Blood pressure is well controlled.  He does not monitor his blood pressure at home and he does continue to smoke up to 1 pack of cigarettes per day.  Continues to endorse BLE swelling. His diet is high in sodium. He has taken up to 40 mg of Lasix daily in the past.  I will restart him on 20 mg of Lasix and have him return in 2 weeks for BMP. Denies chest pain, shortness of breath, palpitations, lightheadedness, dizziness, headaches   BP Readings from Last 3 Encounters:  06/15/20 125/76  03/17/20 133/75  12/30/18 (!) 162/78       Past Medical History:  Diagnosis Date  . Bone spur   . Flu   . Hypertension   . Kidney stone   . Prediabetes     Past Surgical History:  Procedure Laterality Date  . ELBOW SURGERY  2019    Family History  Problem Relation Age of Onset  . Hypertension Mother   . Kidney disease Mother   . Heart disease Mother   . Cancer Father     Social History   Socioeconomic History  . Marital status: Married    Spouse name: Not on file  . Number of children: Not on file  . Years of education: Not on file  . Highest education level: Not on file  Occupational History  . Not on file  Tobacco Use  . Smoking status: Current Every Day Smoker    Packs/day: 0.10    Types: Cigarettes    Last attempt to quit: 02/16/2017    Years since quitting: 3.5  . Smokeless tobacco: Never Used  Vaping Use  . Vaping Use: Never used  Substance and Sexual Activity  . Alcohol use: No    Comment: former quit January 2018  . Drug use: No    Types: Cocaine    Comment: last use in April 2018  . Sexual activity: Yes  Other Topics Concern  . Not on file  Social History Narrative  . Not on file   Social Determinants of Health   Financial Resource Strain: Not on file  Food Insecurity: Not on file  Transportation Needs: Not on  file  Physical Activity: Not on file  Stress: Not on file  Social Connections: Not on file     Observations/Objective: Awake, alert and oriented x 3   Review of Systems  Constitutional: Negative for fever, malaise/fatigue and weight loss.  HENT: Negative.  Negative for nosebleeds.   Eyes: Negative.  Negative for blurred vision, double vision and photophobia.  Respiratory: Negative.  Negative for cough, shortness of breath and wheezing.   Cardiovascular: Positive for leg swelling. Negative for chest pain, palpitations and PND.  Gastrointestinal: Negative.  Negative for heartburn, nausea and vomiting.  Musculoskeletal: Negative.  Negative for myalgias.   Neurological: Negative.  Negative for dizziness, focal weakness, seizures and headaches.  Psychiatric/Behavioral: Negative.  Negative for suicidal ideas.    Assessment and Plan: Shanon was seen today for follow-up.  Diagnoses and all orders for this visit:  Essential hypertension -     Basic metabolic panel; Future Continue all antihypertensives as prescribed.  Remember to bring in your blood pressure log with you for your follow up appointment.  DASH/Mediterranean Diets are healthier choices for HTN.    Edema of both lower legs -     furosemide (LASIX) 20 MG tablet; Take 1 tablet (20 mg total) by mouth daily. -     Basic metabolic panel; Future Avoid high sodium foods: processed foods, pork, eating take out    Follow Up Instructions No follow-ups on file.     I discussed the assessment and treatment plan with the patient. The patient was provided an opportunity to ask questions and all were answered. The patient agreed with the plan and demonstrated an understanding of the instructions.   The patient was advised to call back or seek an in-person evaluation if the symptoms worsen or if the condition fails to improve as anticipated.  I provided 13 minutes of non-face-to-face time during this encounter including median intraservice time, reviewing previous notes, labs, imaging, medications and explaining diagnosis and management.  Claiborne Rigg, FNP-BC

## 2020-09-15 NOTE — Addendum Note (Signed)
Addended by: Bertram Denver on: 09/15/2020 08:54 AM   Modules accepted: Orders

## 2020-10-04 MED FILL — HYDROCHLOROTHIAZIDE 25 MG T: 25 | 30 days supply | Qty: 30 | Fill #0

## 2020-10-04 MED FILL — FUROSEMIDE 20 MG TABS: 20 | 30 days supply | Qty: 30 | Fill #0

## 2020-10-05 ENCOUNTER — Other Ambulatory Visit: Payer: Self-pay

## 2020-12-06 ENCOUNTER — Ambulatory Visit: Payer: Self-pay | Admitting: Nurse Practitioner

## 2020-12-13 ENCOUNTER — Telehealth: Payer: Self-pay | Admitting: Nurse Practitioner

## 2020-12-13 NOTE — Telephone Encounter (Signed)
Provider Meredeth Ide working virtual in 5/11. Called Pt no answer. Left vm that appt will be virtual but if in person in preferred to call [phone] to rescheduled.

## 2020-12-15 ENCOUNTER — Ambulatory Visit: Payer: Self-pay | Attending: Nurse Practitioner | Admitting: Nurse Practitioner

## 2020-12-15 ENCOUNTER — Encounter: Payer: Self-pay | Admitting: Nurse Practitioner

## 2020-12-15 ENCOUNTER — Other Ambulatory Visit: Payer: Self-pay

## 2020-12-15 ENCOUNTER — Other Ambulatory Visit: Payer: Self-pay | Admitting: Nurse Practitioner

## 2020-12-15 DIAGNOSIS — Z130 Encounter for screening for diseases of the blood and blood-forming organs and certain disorders involving the immune mechanism: Secondary | ICD-10-CM

## 2020-12-15 DIAGNOSIS — Z1159 Encounter for screening for other viral diseases: Secondary | ICD-10-CM

## 2020-12-15 DIAGNOSIS — I10 Essential (primary) hypertension: Secondary | ICD-10-CM

## 2020-12-15 DIAGNOSIS — R7303 Prediabetes: Secondary | ICD-10-CM

## 2020-12-15 MED ORDER — LOSARTAN POTASSIUM 25 MG PO TABS
25.0000 mg | ORAL_TABLET | Freq: Every day | ORAL | 0 refills | Status: DC
  Filled 2020-12-15: qty 30, 30d supply, fill #0

## 2020-12-15 NOTE — Addendum Note (Signed)
Addended byMemory Dance on: 12/15/2020 03:52 PM   Modules accepted: Orders

## 2020-12-15 NOTE — Progress Notes (Signed)
Virtual Visit via Telephone Note Due to national recommendations of social distancing due to [place] 19, telehealth visit is felt to be most appropriate for this patient at this time.  I discussed the limitations, risks, security and privacy concerns of performing an evaluation and management service by telephone and the availability of in person appointments. I also discussed with the patient that there may be a patient responsible charge related to this service. The patient expressed understanding and agreed to proceed.    I connected with Alex [name] on 12/15/20  at   9:10 AM EDT  EDT by telephone and verified that I am speaking with the correct person using two identifiers.   Consent I discussed the limitations, risks, security and privacy concerns of performing an evaluation and management service by telephone and the availability of in person appointments. I also discussed with the patient that there may be a patient responsible charge related to this service. The patient expressed understanding and agreed to proceed.   Location of Patient: Private Residence    Location of Provider: [facility] [employer] [facility]    Persons participating in Telemedicine visit: Alex Rankins FNP-BC [place]    History of Present Illness: Telemedicine visit for: Follow up He has a past medical history of Bone spur, Flu, Hypertension, Kidney stone, and Prediabetes.  Essential Hypertension He stopped taking hydrochlorothiazide 25 mg daily several weeks ago due to intense muscle cramping.  He also had stopped taking furosemide prior to stopping the hydrochlorothiazide.  He was initially taking furosemide for bilateral lower extremity edema.  We will trial low-dose losartan 25 mg daily.  He states the muscle cramping resolved after stopping HCTZ. Denies chest pain, shortness of breath, palpitations, lightheadedness, dizziness, headaches or BLE edema.  BP Readings from  Last 3 Encounters:  06/15/20 125/76  03/17/20 133/75  12/30/18 (!) 162/78       Past Medical History:  Diagnosis Date  . Bone spur   . Flu   . Hypertension   . Kidney stone   . Prediabetes     Past Surgical History:  Procedure Laterality Date  . ELBOW SURGERY  2019    Family History  Problem Relation Age of Onset  . Hypertension Mother   . Kidney disease Mother   . Heart disease Mother   . Cancer Father     Social History   Socioeconomic History  . Marital status: Married    Spouse name: Not on file  . Number of children: Not on file  . Years of education: Not on file  . Highest education level: Not on file  Occupational History  . Not on file  Tobacco Use  . Smoking status: Current Every Day Smoker    Packs/day: 0.10    Types: Cigarettes    Last attempt to quit: 02/16/2017    Years since quitting: 3.8  . Smokeless tobacco: Never Used  Vaping Use  . Vaping Use: Never used  Substance and Sexual Activity  . Alcohol use: No    Comment: former quit January 2018  . Drug use: No    Types: Cocaine    Comment: last use in April 2018  . Sexual activity: Yes  Other Topics Concern  . Not on file  Social History Narrative  . Not on file   Social Determinants of Health   Financial Resource Strain: Not on file  Food Insecurity: Not on file  Transportation Needs: Not on file  Physical  Activity: Not on file  Stress: Not on file  Social Connections: Not on file     Observations/Objective: Awake, alert and oriented x 3   Review of Systems  Constitutional: Negative for fever, malaise/fatigue and weight loss.  HENT: Negative.  Negative for nosebleeds.   Eyes: Negative.  Negative for blurred vision, double vision and photophobia.  Respiratory: Negative.  Negative for cough and shortness of breath.   Cardiovascular: Negative.  Negative for chest pain, palpitations and leg swelling.  Gastrointestinal: Negative.  Negative for heartburn, nausea and vomiting.   Musculoskeletal: Negative.  Negative for myalgias.  Neurological: Negative.  Negative for dizziness, focal weakness, seizures and headaches.  Psychiatric/Behavioral: Negative.  Negative for suicidal ideas.    Assessment and Plan: Diagnoses and all orders for this visit:  Essential hypertension -     CMP14+EGFR; Future -     losartan (COZAAR) 25 MG tablet; Take 1 tablet (25 mg total) by mouth daily. Continue all antihypertensives as prescribed.  Remember to bring in your blood pressure log with you for your follow up appointment.  DASH/Mediterranean Diets are healthier choices for HTN.    Prediabetes -     Hemoglobin A1c; Future   Need for hepatitis C screening test -     HCV Ab w Reflex to Quant PCR; Future  Screening for deficiency anemia -     CBC; Future     Follow Up Instructions Return in about 3 months (around 03/17/2021) for [name] BP CHECK 6-3 '@4pm' . See me on 03-09-2021 @ 410pm.     I discussed the assessment and treatment plan with the patient. The patient was provided an opportunity to ask questions and all were answered. The patient agreed with the plan and demonstrated an understanding of the instructions.   The patient was advised to call back or seek an in-person evaluation if the symptoms worsen or if the condition fails to improve as anticipated.  I provided 17 minutes of non-face-to-face time during this encounter including median intraservice time, reviewing previous notes, labs, imaging, medications and explaining diagnosis and management.  Gildardo Pounds, FNP-BC

## 2020-12-16 LAB — CMP14+EGFR
ALT: 27 IU/L (ref 0–44)
AST: 24 IU/L (ref 0–40)
Albumin/Globulin Ratio: 1.8 (ref 1.2–2.2)
Albumin: 4.6 g/dL (ref 3.8–4.9)
Alkaline Phosphatase: 90 IU/L (ref 44–121)
BUN/Creatinine Ratio: 12 (ref 9–20)
BUN: 12 mg/dL (ref 6–24)
Bilirubin Total: 0.2 mg/dL (ref 0.0–1.2)
CO2: 27 mmol/L (ref 20–29)
Calcium: 9.4 mg/dL (ref 8.7–10.2)
Chloride: 104 mmol/L (ref 96–106)
Creatinine, Ser: 1 mg/dL (ref 0.76–1.27)
Globulin, Total: 2.5 g/dL (ref 1.5–4.5)
Glucose: 125 mg/dL — ABNORMAL HIGH (ref 65–99)
Potassium: 4 mmol/L (ref 3.5–5.2)
Sodium: 143 mmol/L (ref 134–144)
Total Protein: 7.1 g/dL (ref 6.0–8.5)
eGFR: 88 mL/min/{1.73_m2} (ref >59)

## 2020-12-16 LAB — CBC
Hematocrit: 45.7 % (ref 37.5–51.0)
Hemoglobin: 15.1 g/dL (ref 13.0–17.7)
MCH: 29.4 pg (ref 26.6–33.0)
MCHC: 33 g/dL (ref 31.5–35.7)
MCV: 89 fL (ref 79–97)
NRBC: 1 % — ABNORMAL HIGH (ref 0–0)
Platelets: 261 10*3/uL (ref 150–450)
RBC: 5.13 x10E6/uL (ref 4.14–5.80)
RDW: 13 % (ref 11.6–15.4)
WBC: 7.4 10*3/uL (ref 3.4–10.8)

## 2020-12-16 LAB — HEMOGLOBIN A1C
Est. average glucose Bld gHb Est-mCnc: 134 mg/dL
Hgb A1c MFr Bld: 6.3 % — ABNORMAL HIGH (ref 4.8–5.6)

## 2020-12-16 LAB — HCV INTERPRETATION

## 2020-12-16 LAB — HCV AB W REFLEX TO QUANT PCR: HCV Ab: <0.1 s/co ratio (ref 0.0–0.9)

## 2021-01-07 ENCOUNTER — Encounter: Payer: Self-pay | Admitting: Pharmacist

## 2021-01-07 ENCOUNTER — Other Ambulatory Visit: Payer: Self-pay

## 2021-01-07 ENCOUNTER — Ambulatory Visit: Payer: Self-pay | Attending: Nurse Practitioner | Admitting: Pharmacist

## 2021-01-07 VITALS — BP 147/71

## 2021-01-07 DIAGNOSIS — I10 Essential (primary) hypertension: Secondary | ICD-10-CM

## 2021-01-07 MED ORDER — AMLODIPINE BESYLATE 5 MG PO TABS
2.5000 mg | ORAL_TABLET | Freq: Every day | ORAL | 0 refills | Status: DC
  Filled 2021-01-07 – 2021-01-21 (×2): qty 15, 30d supply, fill #0

## 2021-01-07 NOTE — Progress Notes (Signed)
   S:    PCP: Zelda   Patient arrives in good spirits. Presents to the clinic for hypertension evaluation, counseling, and management. Patient was referred and last seen by Primary Care Provider on 12/15/2020. He was changed from HCTZ to losartan d/t cramping.    Medication adherence denied. He stopped losartan after 4-5 days of taking d/t cramping.  Current BP Medications include:  Losartan 25 mg daily (stopped d/t cramping)  Antihypertensives tried in the past include: HCTZ (cramping), losartan (cramping), amlodipine 5 mg (pedal edema)  Dietary habits include: pt reports that he limits salt; does drink coffee daily  Exercise habits include: walks ~1 mile per day Family / Social history:  Fhx: HTN, kidney disease, heart disease  Tobacco: current every day smoker  Alcohol: denies use   O:  Vitals:   01/07/21 1615  BP: (!) 147/71    Home BP readings: none   Last 3 Office BP readings: BP Readings from Last 3 Encounters:  01/07/21 (!) 147/71  06/15/20 125/76  03/17/20 133/75    BMET    Component Value Date/Time   NA 143 12/15/2020 1554   K 4.0 12/15/2020 1554   CL 104 12/15/2020 1554   CO2 27 12/15/2020 1554   GLUCOSE 125 (H) 12/15/2020 1554   GLUCOSE 139 (H) 12/30/2018 0354   BUN 12 12/15/2020 1554   CREATININE 1.00 12/15/2020 1554   CREATININE 1.21 04/11/2017 1103   CALCIUM 9.4 12/15/2020 1554   GFRNONAA 91 06/15/2020 0925   GFRNONAA 68 04/11/2017 1103   GFRAA 106 06/15/2020 0925   GFRAA 79 04/11/2017 1103    Renal function: CrCl cannot be calculated (Patient's most recent lab result is older than the maximum 21 days allowed.).  Clinical ASCVD: No  The 10-year ASCVD risk score Denman George DC Jr., et al., 2013) is: 26.8%   Values used to calculate the score:     Age: 55 years     Sex: Male     Is Non-Hispanic African American: Yes     Diabetic: No     Tobacco smoker: Yes     Systolic Blood Pressure: 147 mmHg     Is BP treated: Yes     HDL Cholesterol: 35  mg/dL     Total Cholesterol: 184 mg/dL   A/P: Hypertension longstanding currently uncontrolled on current medications. BP Goal = < 130/80 mmHg. Medication adherence denied d/t side effects. He does not recall taking amlodipine. He was previously taken off of this d/t pedal edema. However, upon review of provider notes pt reported stopping amlodipine 8-9 months prior to the edema being noted in clinic. He is having intolerances to other medication classes. I recommend retrial of amlodipine at a lower dose to see if he can tolerate this.    -Started amlodipine 2.5 mg daily -Stop losartan.   -Counseled on lifestyle modifications for blood pressure control including reduced dietary sodium, increased exercise, adequate sleep.  Results reviewed and written information provided.   Total time in face-to-face counseling 15 minutes.   F/U Clinic Visit in 1 month.   Butch Penny, PharmD, [place]y [place], CPP Clinical Pharmacist [facility] [phone]

## 2021-01-14 ENCOUNTER — Other Ambulatory Visit: Payer: Self-pay

## 2021-01-21 ENCOUNTER — Encounter (INDEPENDENT_AMBULATORY_CARE_PROVIDER_SITE_OTHER): Payer: Self-pay

## 2021-01-21 ENCOUNTER — Ambulatory Visit: Payer: Self-pay | Attending: Nurse Practitioner | Admitting: Pharmacist

## 2021-01-21 ENCOUNTER — Other Ambulatory Visit: Payer: Self-pay

## 2021-01-21 ENCOUNTER — Telehealth: Payer: Self-pay | Admitting: Pharmacist

## 2021-01-21 VITALS — BP 142/64

## 2021-01-21 DIAGNOSIS — I10 Essential (primary) hypertension: Secondary | ICD-10-CM

## 2021-01-21 NOTE — Progress Notes (Signed)
   S:    PCP: Zelda   Patient arrives in good spirits. Presents to the clinic for hypertension evaluation, counseling, and management. Patient was referred and last seen by Primary Care Provider on 12/15/2020. I saw him on 01/07/2021 and changed his losartan to amlodipine d/t cramping with the losartan. We started 2.5 mg daily because of his history of LE edema with amlodipine at higher doses.   Medication adherence denied. Current BP Medications include:  amlodipine 2.5 mg daily (not taking - never picked this up)  Antihypertensives tried in the past include: HCTZ (cramping), losartan (cramping), amlodipine 5 mg (pedal edema)  Dietary habits include: pt reports that he limits salt; does drink coffee daily  Exercise habits include: walks ~1 mile per day Family / Social history:  Fhx: HTN, kidney disease, heart disease  Tobacco: current every day smoker  Alcohol: denies use   O:  Vitals:   01/21/21 1346  BP: (!) 142/64   Home BP readings: none   Last 3 Office BP readings: BP Readings from Last 3 Encounters:  01/21/21 (!) 142/64  01/07/21 (!) 147/71  06/15/20 125/76    BMET    Component Value Date/Time   NA 143 12/15/2020 1554   K 4.0 12/15/2020 1554   CL 104 12/15/2020 1554   CO2 27 12/15/2020 1554   GLUCOSE 125 (H) 12/15/2020 1554   GLUCOSE 139 (H) 12/30/2018 0354   BUN 12 12/15/2020 1554   CREATININE 1.00 12/15/2020 1554   CREATININE 1.21 04/11/2017 1103   CALCIUM 9.4 12/15/2020 1554   GFRNONAA 91 06/15/2020 0925   GFRNONAA 68 04/11/2017 1103   GFRAA 106 06/15/2020 0925   GFRAA 79 04/11/2017 1103    Renal function: CrCl cannot be calculated (Patient's most recent lab result is older than the maximum 21 days allowed.).  Clinical ASCVD: No  The 10-year ASCVD risk score Denman George DC Jr., et al., 2013) is: 25.3%   Values used to calculate the score:     Age: 55 years     Sex: Male     Is Non-Hispanic African American: Yes     Diabetic: No     Tobacco smoker: Yes      Systolic Blood Pressure: 142 mmHg     Is BP treated: Yes     HDL Cholesterol: 35 mg/dL     Total Cholesterol: 184 mg/dL   A/P: Hypertension longstanding currently uncontrolled on current medications. BP Goal = < 130/80 mmHg. Medication adherence denied - he never picked up the amlodipine.   -Started amlodipine 2.5 mg daily -Counseled on lifestyle modifications for blood pressure control including reduced dietary sodium, increased exercise, adequate sleep.  Results reviewed and written information provided.   Total time in face-to-face counseling 15 minutes.   F/U Clinic Visit in 1 month.   Butch Penny, PharmD, Patsy [place], CPP Clinical Pharmacist [facility] [phone]

## 2021-01-21 NOTE — Telephone Encounter (Signed)
Saw pt today for a BP check. He endorses sinus pressure, drainage, and HA. He tells me these symptoms are similar to when he had a sinus infection. He reports that he took amoxicillin and this worked well for him. He is requesting amox rx today. Will forward to PCP to approve if appropriate.

## 2021-01-23 ENCOUNTER — Other Ambulatory Visit: Payer: Self-pay | Admitting: Nurse Practitioner

## 2021-01-23 MED ORDER — AMOXICILLIN-POT CLAVULANATE 875-125 MG PO TABS
1.0000 | ORAL_TABLET | Freq: Two times a day (BID) | ORAL | 0 refills | Status: AC
  Filled 2021-01-23: qty 14, 7d supply, fill #0

## 2021-01-24 ENCOUNTER — Other Ambulatory Visit: Payer: Self-pay

## 2021-02-18 ENCOUNTER — Ambulatory Visit: Payer: Self-pay | Admitting: Pharmacist
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
## 2021-03-09 ENCOUNTER — Other Ambulatory Visit: Payer: Self-pay

## 2021-03-09 ENCOUNTER — Encounter: Payer: Self-pay | Admitting: Nurse Practitioner

## 2021-03-09 ENCOUNTER — Ambulatory Visit: Payer: Self-pay | Attending: Nurse Practitioner | Admitting: Nurse Practitioner

## 2021-03-09 DIAGNOSIS — I10 Essential (primary) hypertension: Secondary | ICD-10-CM

## 2021-03-09 MED ORDER — AMLODIPINE BESYLATE 5 MG PO TABS
2.5000 mg | ORAL_TABLET | Freq: Every day | ORAL | 0 refills | Status: DC
  Filled 2021-03-09: qty 15, 30d supply, fill #0

## 2021-03-09 MED ORDER — LOSARTAN POTASSIUM 50 MG PO TABS
50.0000 mg | ORAL_TABLET | Freq: Every day | ORAL | 0 refills | Status: DC
  Filled 2021-03-09: qty 30, 30d supply, fill #0

## 2021-03-09 NOTE — Progress Notes (Signed)
Virtual Visit via Telephone Note Due to national recommendations of social distancing due to [place] 19, telehealth visit is felt to be most appropriate for this patient at this time.  I discussed the limitations, risks, security and privacy concerns of performing an evaluation and management service by telephone and the availability of in person appointments. I also discussed with the patient that there may be a patient responsible charge related to this service. The patient expressed understanding and agreed to proceed.    I connected with Alex [name] on 03/09/21  at   4:10 PM EDT  EDT by telephone and verified that I am speaking with the correct person using two identifiers.  Location of Patient: Private Residence   Location of Provider: [place] participating in Telemedicine visit: Geryl Rankins FNP-BC Alex [name]    History of Present Illness: Telemedicine visit for: Essential Hypertension   Blood pressure readings at the local pharmacy: 140-150/70-80s. Will increase losartan to 50 mg from 25 mg daily. He will continue on amlodipine 2.5 mg daily. He stopped taking lasix due to myalgias. He has also reported cramping with losartan in the past as well. Higher doses of amlodipine caused BLE edema. His diet is high in sodium: although he does not add salt to his foods he eats lots of processed and can foods. Denies chest pain, shortness of breath, palpitations, lightheadedness, dizziness, headaches or BLE edema.   BP Readings from Last 3 Encounters:  01/21/21 (!) 142/64  01/07/21 (!) 147/71  06/15/20 125/76       Past Medical History:  Diagnosis Date   Bone spur    Flu    Hypertension    Kidney stone    Prediabetes     Past Surgical History:  Procedure Laterality Date   ELBOW SURGERY  2019    Family History  Problem Relation Age of Onset   Hypertension Mother    Kidney disease Mother    Heart disease Mother    Cancer  Father     Social History   Socioeconomic History   Marital status: Married    Spouse name: Not on file   Number of children: Not on file   Years of education: Not on file   Highest education level: Not on file  Occupational History   Not on file  Tobacco Use   Smoking status: Every Day    Packs/day: 0.10    Types: Cigarettes    Last attempt to quit: 02/16/2017    Years since quitting: 4.0   Smokeless tobacco: Never  Vaping Use   Vaping Use: Never used  Substance and Sexual Activity   Alcohol use: No    Comment: former quit January 2018   Drug use: No    Types: Cocaine    Comment: last use in April 2018   Sexual activity: Yes  Other Topics Concern   Not on file  Social History Narrative   Not on file   Social Determinants of Health   Financial Resource Strain: Not on file  Food Insecurity: Not on file  Transportation Needs: Not on file  Physical Activity: Not on file  Stress: Not on file  Social Connections: Not on file     Observations/Objective: Awake, alert and oriented x 3   Review of Systems  Constitutional:  Negative for fever, malaise/fatigue and weight loss.  HENT: Negative.  Negative for nosebleeds.   Eyes: Negative.  Negative for blurred vision, double  vision and photophobia.  Respiratory: Negative.  Negative for cough and shortness of breath.   Cardiovascular: Negative.  Negative for chest pain, palpitations and leg swelling.  Gastrointestinal: Negative.  Negative for heartburn, nausea and vomiting.  Musculoskeletal: Negative.  Negative for myalgias.  Neurological: Negative.  Negative for dizziness, focal weakness, seizures and headaches.  Psychiatric/Behavioral: Negative.  Negative for suicidal ideas.    Assessment and Plan: Diagnoses and all orders for this visit:  Essential hypertension -     losartan (COZAAR) 50 MG tablet; Take 1 tablet (50 mg total) by mouth daily. -     CMP14+EGFR; Future -     amLODipine (NORVASC) 5 MG tablet; Take 0.5  tablets (2.5 mg total) by mouth daily.  Continue all antihypertensives as prescribed.  Remember to bring in your blood pressure log with you for your follow up appointment.  DASH/Mediterranean Diets are healthier choices for HTN.    Follow Up Instructions Return for luke 4 weeks BP check and BMP. See me in [place].     I discussed the assessment and treatment plan with the patient. The patient was provided an opportunity to ask questions and all were answered. The patient agreed with the plan and demonstrated an understanding of the instructions.   The patient was advised to call back or seek an in-person evaluation if the symptoms worsen or if the condition fails to improve as anticipated.  I provided 14 minutes of non-face-to-face time during this encounter including median intraservice time, reviewing previous notes, labs, imaging, medications and explaining diagnosis and management.  Gildardo Pounds, FNP-BC

## 2021-03-16 ENCOUNTER — Other Ambulatory Visit: Payer: Self-pay

## 2021-04-08 ENCOUNTER — Ambulatory Visit: Payer: Self-pay | Admitting: Pharmacist

## 2021-04-25 ENCOUNTER — Other Ambulatory Visit: Payer: Self-pay

## 2021-05-02 ENCOUNTER — Encounter: Payer: Self-pay | Admitting: Nurse Practitioner

## 2021-05-02 ENCOUNTER — Other Ambulatory Visit: Payer: Self-pay | Admitting: Nurse Practitioner

## 2021-05-02 ENCOUNTER — Other Ambulatory Visit: Payer: Self-pay

## 2021-05-02 ENCOUNTER — Ambulatory Visit (INDEPENDENT_AMBULATORY_CARE_PROVIDER_SITE_OTHER): Payer: Self-pay | Admitting: Nurse Practitioner

## 2021-05-02 ENCOUNTER — Ambulatory Visit: Payer: Self-pay

## 2021-05-02 VITALS — BP 154/73 | HR 59 | Temp 97.7°F | Ht 75.0 in | Wt 290.0 lb

## 2021-05-02 DIAGNOSIS — R7303 Prediabetes: Secondary | ICD-10-CM

## 2021-05-02 DIAGNOSIS — M25512 Pain in left shoulder: Secondary | ICD-10-CM

## 2021-05-02 DIAGNOSIS — R8290 Unspecified abnormal findings in urine: Secondary | ICD-10-CM

## 2021-05-02 DIAGNOSIS — I10 Essential (primary) hypertension: Secondary | ICD-10-CM

## 2021-05-02 LAB — POCT GLYCOSYLATED HEMOGLOBIN (HGB A1C): Hemoglobin A1C: 5.9 % — AB (ref 4.0–5.6)

## 2021-05-02 LAB — POCT URINALYSIS DIP (CLINITEK)
Bilirubin, UA: NEGATIVE
Glucose, UA: NEGATIVE mg/dL
Ketones, POC UA: NEGATIVE mg/dL
Nitrite, UA: NEGATIVE
Spec Grav, UA: >=1.03 — AB (ref 1.010–1.025)
Urobilinogen, UA: 0.2 E.U./dL
pH, UA: 6 (ref 5.0–8.0)

## 2021-05-02 LAB — GLUCOSE, POCT (MANUAL RESULT ENTRY): POC Glucose: 116 mg/dl — AB (ref 70–99)

## 2021-05-02 MED ORDER — LIDOCAINE 5 % EX PTCH: 1.0000 | MEDICATED_PATCH | CUTANEOUS | 0 refills | Status: DC

## 2021-05-02 MED ORDER — DICLOFENAC SODIUM 1 % EX GEL: 4.0000 g | Freq: Four times a day (QID) | CUTANEOUS | 0 refills | Status: DC | PRN

## 2021-05-02 NOTE — Progress Notes (Signed)
[place], [place]  [address] Phone:  [phone]   Fax:  [phone] Subjective:   Patient ID: Alex [name], male    DOB: 25-Feb-1966, 55 y.o.   MRN: [id number]  Chief Complaint  Patient presents with   [place] he is taking too much hypertension medication, stopped taking amlodipine and losartan 9/20.   HPI Alex [name] 55 y.o. male with history of hypertension and prediabetes to the to the [facility] for follow up of chronic illnesses. Patient currently resides at the [facility], has been there for 2 wks. Works at [employer]. States that he stopped taking B/P medications on 9/20 due to side effects. After starting Losartan, began having "blood shot red eyes," and blurred vision. Also states that he was having muscle cramps prior to discontinuation. Denies any symptoms during visit today.  States that he has been having pain and swelling in the bilateral knees which has improved with recommended Ibuprofen 600 mg.   Currently complaining of left shoulder pain x 4 days after working at the [employer]. Currently rates pain 3/10, increases when laying supine at night, with movement and when getting up in the morning. Denies any recent trauma or injury.  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 Regularly goes to [place] for healthcare needs, was schedule at the [facility] by [facility]. States that he would like to complete follow ups and continue to be seen at [facility]. Denies any other complaints today.    Past Medical History:  Diagnosis Date   Bone spur    Flu    Hypertension    Kidney stone    Prediabetes     Past Surgical History:  Procedure Laterality Date   ELBOW SURGERY  2019    Family History  Problem Relation Age of Onset   Hypertension Mother    Kidney disease Mother    Heart disease Mother    Cancer Father     Social History   Socioeconomic History   Marital  status: Married    Spouse name: Not on file   Number of children: Not on file   Years of education: Not on file   Highest education level: Not on file  Occupational History   Not on file  Tobacco Use   Smoking status: Every Day    Packs/day: 0.10    Types: Cigarettes    Last attempt to quit: 02/16/2017    Years since quitting: 4.2   Smokeless tobacco: Never  Vaping Use   Vaping Use: Never used  Substance and Sexual Activity   Alcohol use: No    Comment: former quit January 2018   Drug use: No    Types: Cocaine    Comment: last use in April 2018   Sexual activity: Yes  Other Topics Concern   Not on file  Social History Narrative   Not on file   Social Determinants of Health   Financial Resource Strain: Not on file  Food Insecurity: Not on file  Transportation Needs: Not on file  Physical Activity: Not on file  Stress: Not on file  Social Connections: Not on file  Intimate Partner Violence: Not on file    Outpatient Medications Prior to Visit  Medication Sig Dispense Refill   fluticasone (FLONASE) 50 MCG/ACT nasal spray PLACE 2 SPRAYS INTO BOTH NOSTRILS DAILY. 16 g 6   ibuprofen (ADVIL) 600  MG tablet Take by mouth.     potassium chloride SA (KLOR-CON) 20 MEQ tablet Take by mouth.     amLODipine (NORVASC) 5 MG tablet Take 0.5 tablets (2.5 mg total) by mouth daily. (Patient not taking: Reported on 05/02/2021) 45 tablet 0   losartan (COZAAR) 50 MG tablet Take 1 tablet (50 mg total) by mouth daily. (Patient not taking: Reported on 05/02/2021) 90 tablet 0   No facility-administered medications prior to visit.    No Known Allergies  Review of Systems  Constitutional:  Negative for chills, fever and malaise/fatigue.  Eyes: Negative.   Respiratory:  Negative for cough and shortness of breath.   Cardiovascular:  Negative for chest pain, palpitations and leg swelling.  Gastrointestinal:  Negative for abdominal pain, blood in stool, constipation, diarrhea, nausea and  vomiting.  Musculoskeletal:  Positive for joint pain.  Skin: Negative.   Neurological: Negative.   Psychiatric/Behavioral:  Negative for depression. The patient is not nervous/anxious.   All other systems reviewed and are negative.     Objective:    Physical Exam Vitals reviewed.  Constitutional:      General: He is not in acute distress.    Appearance: Normal appearance.  HENT:     Head: Normocephalic.     Right Ear: Tympanic membrane, ear canal and external ear normal.     Left Ear: Tympanic membrane, ear canal and external ear normal.     Nose: Nose normal.     Mouth/Throat:     Mouth: Mucous membranes are moist.  Cardiovascular:     Rate and Rhythm: Normal rate and regular rhythm.     Pulses: Normal pulses.     Heart sounds: Normal heart sounds.     Comments: No obvious peripheral edema Pulmonary:     Effort: Pulmonary effort is normal.     Breath sounds: Normal breath sounds.  Musculoskeletal:        General: Tenderness present. No swelling, deformity or signs of injury. Normal range of motion.     Cervical back: Normal range of motion and neck supple.     Comments: Mild tenderness with palpation of the diffuse left scapula  Skin:    General: Skin is warm and dry.     Capillary Refill: Capillary refill takes less than 2 seconds.  Neurological:     General: No focal deficit present.     Mental Status: He is alert and oriented to person, place, and time.  Psychiatric:        Mood and Affect: Mood normal.        Behavior: Behavior normal.        Thought Content: Thought content normal.        Judgment: Judgment normal.    BP (!) 154/73 (BP Location: Right Arm, Patient Position: Sitting)   Pulse (!) 59   Temp 97.7 F (36.5 C)   Ht '6\' 3"'  (1.905 m)   Wt 290 lb 0.6 oz (131.6 kg)   SpO2 97%   BMI 36.25 kg/m  Wt Readings from Last 3 Encounters:  05/02/21 290 lb 0.6 oz (131.6 kg)  06/15/20 293 lb 6.4 oz (133.1 kg)  03/17/20 (!) 309 lb (140.2 kg)     Immunization History  Administered Date(s) Administered   PFIZER(Purple Top)SARS-COV-2 Vaccination 10/23/2019, 11/17/2019   Tdap 03/19/2017    Diabetic Foot Exam - Simple   No data filed     Lab Results  Component Value Date   TSH 0.738 06/15/2020  Lab Results  Component Value Date   WBC 7.4 12/15/2020   HGB 15.1 12/15/2020   HCT 45.7 12/15/2020   MCV 89 12/15/2020   PLT 261 12/15/2020   Lab Results  Component Value Date   NA 143 12/15/2020   K 4.0 12/15/2020   CO2 27 12/15/2020   GLUCOSE 125 (H) 12/15/2020   BUN 12 12/15/2020   CREATININE 1.00 12/15/2020   BILITOT 0.2 12/15/2020   ALKPHOS 90 12/15/2020   AST 24 12/15/2020   ALT 27 12/15/2020   PROT 7.1 12/15/2020   ALBUMIN 4.6 12/15/2020   CALCIUM 9.4 12/15/2020   ANIONGAP 8 12/30/2018   EGFR 88 12/15/2020   Lab Results  Component Value Date   CHOL 184 06/15/2020   CHOL 163 04/11/2017   Lab Results  Component Value Date   HDL 35 (L) 06/15/2020   HDL 38 (L) 04/11/2017   Lab Results  Component Value Date   LDLCALC 131 (H) 06/15/2020   LDLCALC 102 (H) 04/11/2017   Lab Results  Component Value Date   TRIG 98 06/15/2020   TRIG 129 04/11/2017   Lab Results  Component Value Date   CHOLHDL 5.3 (H) 06/15/2020   CHOLHDL 4.3 04/11/2017   Lab Results  Component Value Date   HGBA1C 5.9 (A) 05/02/2021   HGBA1C 6.3 (H) 12/15/2020   HGBA1C 6.2 (A) 06/15/2020       Assessment & Plan:   Problem List Items Addressed This Visit       Cardiovascular and Mediastinum   Essential hypertension - Primary   Relevant Orders   POCT URINALYSIS DIP (CLINITEK) (Completed)   Amb Referral to Clinical Pharmacist Encouraged continued diet and exercise efforts  Encouraged continued compliance with medication      Other Visit Diagnoses     Prediabetes       Relevant Orders   POCT URINALYSIS DIP (CLINITEK) (Completed)   HgB A1c (Completed)   Glucose (CBG) (Completed) Encouraged continued diet and  exercise efforts  Encouraged continued compliance with medication     Acute pain of left shoulder       Relevant Medications   lidocaine (LIDODERM) 5 % Patient informed to take prescribed Lidocaine patch with OTC medications as needed for pain  Discussed RICE Discussed ROM exercises Imaging deferred, patient denies any injury/ trauma   Abnormal urinalysis       Relevant Orders   Urine Culture   Follow up in 1 wk with Clinical Pharmacist, than schedule follow up with PCP for reevaluation of chronic illness and left shoulder pain.    I am having Anubis A. Niese start on lidocaine. I am also having him maintain his fluticasone, losartan, amLODipine, ibuprofen, and potassium chloride SA.  Meds ordered this encounter  Medications   lidocaine (LIDODERM) 5 %    Sig: Place 1 patch onto the skin daily for 14 days. Remove & Discard patch within 12 hours or as directed by MD    Dispense:  14 patch    Refill:  0     Teena Dunk, NP

## 2021-05-02 NOTE — Telephone Encounter (Signed)
Returned Pt call. I was given verbal permission to speak to [facility].  Pt last took his BP medications on 04/26/2021. The medications gave him a HA and blurry vision. Pt has not taken these medications since then.   Pt was seen today by Orion Crook. She wanted Mr. Civello to be seen at [facility] in the next week for BP medication management. No appointments available. Pt has appointment on 10/13. Pt will go to UC or mobile clinic if needed.   Javon/pt will separate the 2 medications to see how that affects Mr. Panther and keep a log.   Pt would like an RX for a blood pressure cuff to keep track of BP's prior to next office visit.

## 2021-05-02 NOTE — Patient Instructions (Signed)
You were seen today in the [facility] for follow up visit and left shoulder pain. Labs were collected, results will be available via MyChart or, if abnormal, you will be contacted by clinic staff. You were prescribed medications, please take as directed. Please follow up at [facility] for reevaluation within the next wk.

## 2021-05-03 NOTE — Telephone Encounter (Signed)
I do not see any insurance information for a blood pressure monitor. Is he insured by medicaid? If not he would need to purchase a monitor on his own

## 2021-05-05 LAB — URINE CULTURE

## 2021-05-18 ENCOUNTER — Other Ambulatory Visit: Payer: Self-pay | Admitting: Nurse Practitioner

## 2021-05-18 DIAGNOSIS — M25512 Pain in left shoulder: Secondary | ICD-10-CM

## 2021-05-19 ENCOUNTER — Ambulatory Visit: Payer: Self-pay | Admitting: Physician Assistant

## 2021-06-01 ENCOUNTER — Ambulatory Visit: Payer: Self-pay | Admitting: Nurse Practitioner

## 2021-06-29 ENCOUNTER — Ambulatory Visit: Payer: Self-pay | Admitting: Nurse Practitioner

## 2021-07-20 ENCOUNTER — Other Ambulatory Visit: Payer: Self-pay

## 2021-07-20 ENCOUNTER — Other Ambulatory Visit: Payer: Self-pay | Admitting: Nurse Practitioner

## 2021-07-20 DIAGNOSIS — I10 Essential (primary) hypertension: Secondary | ICD-10-CM

## 2021-07-21 ENCOUNTER — Other Ambulatory Visit: Payer: Self-pay

## 2021-07-21 MED ORDER — AMLODIPINE BESYLATE 5 MG PO TABS
2.5000 mg | ORAL_TABLET | Freq: Every day | ORAL | 0 refills | Status: DC
  Filled 2021-07-21 (×2): qty 15, 30d supply, fill #0

## 2021-07-21 MED ORDER — LOSARTAN POTASSIUM 50 MG PO TABS
50.0000 mg | ORAL_TABLET | Freq: Every day | ORAL | 0 refills | Status: DC
Start: 2021-07-21 — End: 2021-10-31
  Filled 2021-07-21: qty 30, 30d supply, fill #0

## 2021-07-22 ENCOUNTER — Other Ambulatory Visit: Payer: Self-pay

## 2021-07-25 ENCOUNTER — Other Ambulatory Visit: Payer: Self-pay

## 2021-08-26 ENCOUNTER — Other Ambulatory Visit: Payer: Self-pay | Admitting: Family Medicine

## 2021-08-26 ENCOUNTER — Other Ambulatory Visit: Payer: Self-pay

## 2021-08-26 DIAGNOSIS — I10 Essential (primary) hypertension: Secondary | ICD-10-CM

## 2021-08-26 NOTE — Telephone Encounter (Signed)
Requested medication (s) are due for refill today: yes  Requested medication (s) are on the active medication list: yes  Last refill:  cozaar- 07/21/21-10/19/21 #30 0 refills, norvasc- 07/21/21- 10/19/21 #15 0 refills  Future visit scheduled: no  Notes to clinic:  overdue last labs 12/15/20. Called patient left message to call back to schedule future appt .  Do you want to refill rx ?      Requested Prescriptions  Pending Prescriptions Disp Refills   losartan (COZAAR) 50 MG tablet 30 tablet 0    Sig: Take 1 tablet (50 mg total) by mouth daily.     Cardiovascular:  Angiotensin Receptor Blockers Failed - 08/26/2021  1:49 PM      Failed - Cr in normal range and within 180 days    Creat  Date Value Ref Range Status  04/11/2017 1.21 0.70 - 1.33 mg/dL Final    Comment:    For patients >5 years of age, the reference limit for Creatinine is approximately 13% higher for people identified as African-American. .    Creatinine, Ser  Date Value Ref Range Status  12/15/2020 1.00 0.76 - 1.27 mg/dL Final          Failed - K in normal range and within 180 days    Potassium  Date Value Ref Range Status  12/15/2020 4.0 3.5 - 5.2 mmol/L Final          Failed - Last BP in normal range    BP Readings from Last 1 Encounters:  05/02/21 (!) 154/73          Passed - Patient is not pregnant      Passed - Valid encounter within last 6 months    Recent Outpatient Visits           5 months ago Essential hypertension   [facility] [place], Shea Stakes, NP   7 months ago Essential hypertension   [facility] Lois Hux[place], Cornelius Moras, RPH-CPP   7 months ago Essential hypertension   [facility] [place], Cornelius Moras, RPH-CPP   8 months ago Essential hypertension   [facility] [place], [place] W, NP   11 months ago Essential hypertension   [facility]  [place], [place] W, NP               amLODipine (NORVASC) 5 MG tablet 15 tablet 0    Sig: Take 0.5 tablets (2.5 mg total) by mouth daily.     Cardiovascular:  Calcium Channel Blockers Failed - 08/26/2021  1:49 PM      Failed - Last BP in normal range    BP Readings from Last 1 Encounters:  05/02/21 (!) 154/73          Passed - Valid encounter within last 6 months    Recent Outpatient Visits           5 months ago Essential hypertension   [place] [facility] [place], Shea Stakes, NP   7 months ago Essential hypertension   [facility] Lois Huxley, Cornelius Moras, RPH-CPP   7 months ago Essential hypertension   [facility] Drucilla Chalet, RPH-CPP   8 months ago Essential hypertension   [facility] Claiborne Rigg, NP   11 months ago Essential hypertension   [place]  [facility] [place], Shea Stakes, NP

## 2021-08-29 ENCOUNTER — Other Ambulatory Visit: Payer: Self-pay

## 2021-10-31 ENCOUNTER — Other Ambulatory Visit: Payer: Self-pay | Admitting: Family Medicine

## 2021-10-31 ENCOUNTER — Other Ambulatory Visit: Payer: Self-pay

## 2021-10-31 DIAGNOSIS — I10 Essential (primary) hypertension: Secondary | ICD-10-CM

## 2021-11-02 ENCOUNTER — Other Ambulatory Visit: Payer: Self-pay

## 2021-11-02 MED ORDER — AMLODIPINE BESYLATE 5 MG PO TABS
2.5000 mg | ORAL_TABLET | Freq: Every day | ORAL | 2 refills | Status: DC
  Filled 2021-11-02: qty 15, 30d supply, fill #0

## 2021-11-02 MED ORDER — LOSARTAN POTASSIUM 50 MG PO TABS
50.0000 mg | ORAL_TABLET | Freq: Every day | ORAL | 2 refills | Status: DC
  Filled 2021-11-02: qty 30, 30d supply, fill #0

## 2021-11-02 NOTE — Telephone Encounter (Signed)
Requested medication (s) are due for refill today: yes ? ?Requested medication (s) are on the active medication list: yes ? ?Last refill: Cozaar 07/21/21 #30 with 0 RF, Norvasc 07/21/21 #15 0 RF  ? ?Future visit scheduled: 01/11/22 ? ?Notes to clinic:  Has already had a curtesy refill  but there is an upcoming appointment scheduled for 01/11/22, please assess.  ? ? ? ?  ? ?Requested Prescriptions  ?Pending Prescriptions Disp Refills  ? losartan (COZAAR) 50 MG tablet 30 tablet 0  ?  Sig: Take 1 tablet (50 mg total) by mouth daily.  ?  ? Cardiovascular:  Angiotensin Receptor Blockers Failed - 10/31/2021 12:34 PM  ?  ?  Failed - Cr in normal range and within 180 days  ?  Creat  ?Date Value Ref Range Status  ?04/11/2017 1.21 0.70 - 1.33 mg/dL Final  ?  Comment:  ?  For patients >31 years of age, the reference limit ?for Creatinine is approximately 13% higher for people ?identified as African-American. ?. ?  ? ?Creatinine, Ser  ?Date Value Ref Range Status  ?12/15/2020 1.00 0.76 - 1.27 mg/dL Final  ?  ?  ?  ?  Failed - K in normal range and within 180 days  ?  Potassium  ?Date Value Ref Range Status  ?12/15/2020 4.0 3.5 - 5.2 mmol/L Final  ?  ?  ?  ?  Failed - Last BP in normal range  ?  BP Readings from Last 1 Encounters:  ?05/02/21 (!) 154/73  ?  ?  ?  ?  Failed - Valid encounter within last 6 months  ?  Recent Outpatient Visits   ? ?      ? 7 months ago Essential hypertension  ? [facility] [place], [place] W, NP  ? 9 months ago Essential hypertension  ? [facility] [place], Cornelius Moras, RPH-CPP  ? 9 months ago Essential hypertension  ? [facility] Lo[place] Huxley, Cornelius Moras, RPH-CPP  ? 10 months ago Essential hypertension  ? [facility] [place], Shea Stakes, NP  ? 1 year ago Essential hypertension  ? [facility] Claiborne Rigg, NP  ? ?  ?  ?Future Appointments   ? ?        ? In 2  months Claiborne Rigg, NP [facility]  ? ?  ? ?  ?  ?  Passed - Patient is not pregnant  ?  ?  ? amLODipine (NORVASC) 5 MG tablet 15 tablet 0  ?  Sig: Take 0.5 tablets (2.5 mg total) by mouth daily.  ?  ? Cardiovascular: Calcium Channel Blockers 2 Failed - 10/31/2021 12:34 PM  ?  ?  Failed - Last BP in normal range  ?  BP Readings from Last 1 Encounters:  ?05/02/21 (!) 154/73  ?  ?  ?  ?  Failed - Valid encounter within last 6 months  ?  Recent Outpatient Visits   ? ?      ? 7 months ago Essential hypertension  ? [facility] [place], [place] W, NP  ? 9 months ago Essential hypertension  ? [facility] Lois Huxley, Cornelius Moras, RPH-CPP  ? 9 months ago Essential hypertension  ? [facility] Lois Huxley, Cornelius Moras, RPH-CPP  ? 10 months ago  Essential hypertension  ? [facility] [place], Shea Stakes, NP  ? 1 year ago Essential hypertension  ? [facility] Claiborne Rigg, NP  ? ?  ?  ?Future Appointments   ? ?        ? In 2 months Claiborne Rigg, NP [facility]  ? ?  ? ?  ?  ?  Passed - Last Heart Rate in normal range  ?  Pulse Readings from Last 1 Encounters:  ?05/02/21 (!) 59  ?  ?  ?  ?  ? ? ?

## 2021-11-04 ENCOUNTER — Other Ambulatory Visit: Payer: Self-pay

## 2021-12-26 ENCOUNTER — Telehealth: Payer: Self-pay | Admitting: Pharmacist

## 2021-12-26 NOTE — Telephone Encounter (Signed)
Patient attempted to be outreached by Park Liter, PharmD Candidate on 12/26/21 to discuss hypertension.   Cell number rings to [facility] where patient is no longer receiving services. Voicemail was left on home number with call back number and reminder of upcoming appointment.    Catie Hedwig Morton, PharmD, [facility] [phone]

## 2022-01-11 ENCOUNTER — Other Ambulatory Visit: Payer: Self-pay

## 2022-01-11 ENCOUNTER — Ambulatory Visit: Payer: Self-pay | Attending: Nurse Practitioner | Admitting: Nurse Practitioner

## 2022-01-11 ENCOUNTER — Encounter: Payer: Self-pay | Admitting: Nurse Practitioner

## 2022-01-11 VITALS — BP 119/77 | HR 79 | Wt 292.4 lb

## 2022-01-11 DIAGNOSIS — E785 Hyperlipidemia, unspecified: Secondary | ICD-10-CM

## 2022-01-11 DIAGNOSIS — M779 Enthesopathy, unspecified: Secondary | ICD-10-CM

## 2022-01-11 DIAGNOSIS — R7303 Prediabetes: Secondary | ICD-10-CM

## 2022-01-11 DIAGNOSIS — R7989 Other specified abnormal findings of blood chemistry: Secondary | ICD-10-CM

## 2022-01-11 DIAGNOSIS — I10 Essential (primary) hypertension: Secondary | ICD-10-CM

## 2022-01-11 LAB — POCT GLYCOSYLATED HEMOGLOBIN (HGB A1C): HbA1c, POC (prediabetic range): 6.2 % (ref 5.7–6.4)

## 2022-01-11 LAB — GLUCOSE, POCT (MANUAL RESULT ENTRY): POC Glucose: 140 mg/dl — AB (ref 70–99)

## 2022-01-11 MED ORDER — MELOXICAM 15 MG PO TABS
15.0000 mg | ORAL_TABLET | Freq: Every day | ORAL | 0 refills | Status: DC
  Filled 2022-01-11: qty 30, 30d supply, fill #0

## 2022-01-11 NOTE — Progress Notes (Signed)
Assessment & Plan:  Alex [name] was seen today for hypertension and medication reaction.  Diagnoses and all orders for this visit:  Essential hypertension -     CMP14+EGFR  Prediabetes -     POCT glucose (manual entry) -     POCT glycosylated hemoglobin (Hb A1C) -     CMP14+EGFR  Dyslipidemia, goal LDL below 100 -     Lipid panel  Abnormal CBC -     CBC with Differential  Bone spur of other site -     meloxicam (MOBIC) 15 MG tablet; Take 1 tablet (15 mg total) by mouth daily.    Patient has been counseled on age-appropriate routine health concerns for screening and prevention. These are reviewed and up-to-date. Referrals have been placed accordingly. Immunizations are up-to-date or declined.    Subjective:   Chief Complaint  Patient presents with   Hypertension   Medication Reaction    Amlodipine and losartan    Hypertension Pertinent negatives include no blurred vision, chest pain, headaches, malaise/fatigue, palpitations or shortness of breath.  Alex [name] 56 y.o. male presents to office today for follow up to HTN and prediabetes. He has a past medical history of Bone spur, Flu, Hypertension, Kidney stone, and Prediabetes.   HTN States when he takes his blood pressure medications (amlodipine and losartan) he begins to experience severe muscle cramping specifically in both hands.  He has not taken either blood pressure medication in several weeks.   BP Readings from Last 3 Encounters:  01/11/22 119/77  05/02/21 (!) 154/73  01/21/21 (!) 142/64     Prediabetes Well-controlled with diet only at this time. Lab Results  Component Value Date   HGBA1C 6.2 01/11/2022     Joint pain Reports numerous bone spurs in both knees that cause significant pain intermittently.  He has been taking ibuprofen 800 mg twice a day for this.  At this time we will switch to meloxicam once a day. He also endorses mouth sores that he has had however I do not have any imaging to confirm  this.   Review of Systems  Constitutional:  Negative for fever, malaise/fatigue and weight loss.  HENT: Negative.  Negative for nosebleeds.   Eyes: Negative.  Negative for blurred vision, double vision and photophobia.  Respiratory: Negative.  Negative for cough and shortness of breath.   Cardiovascular: Negative.  Negative for chest pain, palpitations and leg swelling.  Gastrointestinal: Negative.  Negative for heartburn, nausea and vomiting.  Musculoskeletal:  Positive for joint pain. Negative for myalgias.  Neurological: Negative.  Negative for dizziness, focal weakness, seizures and headaches.  Psychiatric/Behavioral: Negative.  Negative for suicidal ideas.    Past Medical History:  Diagnosis Date   Bone spur    Flu    Hypertension    Kidney stone    Prediabetes     Past Surgical History:  Procedure Laterality Date   ELBOW SURGERY  2019    Family History  Problem Relation Age of Onset   Hypertension Mother    Kidney disease Mother    Heart disease Mother    Cancer Father     Social History Reviewed with no changes to be made today.   Outpatient Medications Prior to Visit  Medication Sig Dispense Refill   diclofenac Sodium (VOLTAREN) 1 % GEL APPLY 4GM TO LEFT SHOULDER FOUR TIMES A DAY AS NEEDED FOR PAIN 100 g 0   potassium chloride SA (KLOR-CON) 20 MEQ tablet Take by mouth.  ibuprofen (ADVIL) 600 MG tablet Take by mouth.     fluticasone (FLONASE) 50 MCG/ACT nasal spray PLACE 2 SPRAYS INTO BOTH NOSTRILS DAILY. 16 g 6   amLODipine (NORVASC) 5 MG tablet Take 0.5 tablets (2.5 mg total) by mouth daily. (Patient not taking: Reported on 01/11/2022) 15 tablet 2   losartan (COZAAR) 50 MG tablet Take 1 tablet (50 mg total) by mouth daily. (Patient not taking: Reported on 01/11/2022) 30 tablet 2   No facility-administered medications prior to visit.    No Known Allergies     Objective:    BP 119/77   Pulse 79   Wt 292 lb 6.4 oz (132.6 kg)   SpO2 92%   BMI 36.55 kg/m   Wt Readings from Last 3 Encounters:  01/11/22 292 lb 6.4 oz (132.6 kg)  05/02/21 290 lb 0.6 oz (131.6 kg)  06/15/20 293 lb 6.4 oz (133.1 kg)    Physical Exam Vitals and nursing note reviewed.  Constitutional:      Appearance: He is well-developed.  HENT:     Head: Normocephalic and atraumatic.  Cardiovascular:     Rate and Rhythm: Normal rate and regular rhythm.     Heart sounds: Normal heart sounds. No murmur heard.   No friction rub. No gallop.  Pulmonary:     Effort: Pulmonary effort is normal. No tachypnea or respiratory distress.     Breath sounds: Normal breath sounds. No decreased breath sounds, wheezing, rhonchi or rales.  Chest:     Chest wall: No tenderness.  Abdominal:     General: Bowel sounds are normal.     Palpations: Abdomen is soft.  Musculoskeletal:        General: Normal range of motion.     Cervical back: Normal range of motion.     Right knee: Deformity (Bone spurs) present.     Left knee: Deformity (Bone spurs) present.  Skin:    General: Skin is warm and dry.  Neurological:     Mental Status: He is alert and oriented to person, place, and time.     Coordination: Coordination normal.  Psychiatric:        Behavior: Behavior normal. Behavior is cooperative.        Thought Content: Thought content normal.        Judgment: Judgment normal.         Patient has been counseled extensively about nutrition and exercise as well as the importance of adherence with medications and regular follow-up. The patient was given clear instructions to go to ER or return to medical center if symptoms don't improve, worsen or new problems develop. The patient verbalized understanding.   Follow-up: Return in about 3 months (around 04/13/2022).   Gildardo Pounds, FNP-BC [facility] [place], [place]   01/11/2022, 2:38 PM

## 2022-01-11 NOTE — Patient Instructions (Signed)
Blood pressure goal 120/70 call if higher than 130/80 consistently   Call in a few weeks if no improvement with bone spurs

## 2022-01-12 ENCOUNTER — Other Ambulatory Visit: Payer: Self-pay | Admitting: Nurse Practitioner

## 2022-01-12 ENCOUNTER — Other Ambulatory Visit: Payer: Self-pay

## 2022-01-12 LAB — CMP14+EGFR
ALT: 23 IU/L (ref 0–44)
AST: 24 IU/L (ref 0–40)
Albumin/Globulin Ratio: 1.6 (ref 1.2–2.2)
Albumin: 4.3 g/dL (ref 3.8–4.9)
Alkaline Phosphatase: 83 IU/L (ref 44–121)
BUN/Creatinine Ratio: 16 (ref 9–20)
BUN: 18 mg/dL (ref 6–24)
Bilirubin Total: <0.2 mg/dL (ref 0.0–1.2)
CO2: 21 mmol/L (ref 20–29)
Calcium: 9.6 mg/dL (ref 8.7–10.2)
Chloride: 104 mmol/L (ref 96–106)
Creatinine, Ser: 1.15 mg/dL (ref 0.76–1.27)
Globulin, Total: 2.7 g/dL (ref 1.5–4.5)
Glucose: 88 mg/dL (ref 70–99)
Potassium: 4.6 mmol/L (ref 3.5–5.2)
Sodium: 140 mmol/L (ref 134–144)
Total Protein: 7 g/dL (ref 6.0–8.5)
eGFR: 74 mL/min/{1.73_m2} (ref >59)

## 2022-01-12 LAB — CBC WITH DIFFERENTIAL/PLATELET
Basophils Absolute: 0.1 10*3/uL (ref 0.0–0.2)
Basos: 1 %
EOS (ABSOLUTE): 0.2 10*3/uL (ref 0.0–0.4)
Eos: 2 %
Hematocrit: 45.1 % (ref 37.5–51.0)
Hemoglobin: 15.3 g/dL (ref 13.0–17.7)
Immature Grans (Abs): 0 10*3/uL (ref 0.0–0.1)
Immature Granulocytes: 0 %
Lymphocytes Absolute: 3.3 10*3/uL — ABNORMAL HIGH (ref 0.7–3.1)
Lymphs: 50 %
MCH: 29.7 pg (ref 26.6–33.0)
MCHC: 33.9 g/dL (ref 31.5–35.7)
MCV: 87 fL (ref 79–97)
Monocytes Absolute: 0.8 10*3/uL (ref 0.1–0.9)
Monocytes: 11 %
Neutrophils Absolute: 2.4 10*3/uL (ref 1.4–7.0)
Neutrophils: 36 %
Platelets: 252 10*3/uL (ref 150–450)
RBC: 5.16 x10E6/uL (ref 4.14–5.80)
RDW: 12.5 % (ref 11.6–15.4)
WBC: 6.6 10*3/uL (ref 3.4–10.8)

## 2022-01-12 LAB — LIPID PANEL
Chol/HDL Ratio: 6.3 ratio — ABNORMAL HIGH (ref 0.0–5.0)
Cholesterol, Total: 170 mg/dL (ref 100–199)
HDL: 27 mg/dL — ABNORMAL LOW (ref >39)
LDL Chol Calc (NIH): 93 mg/dL (ref 0–99)
Triglycerides: 298 mg/dL — ABNORMAL HIGH (ref 0–149)
VLDL Cholesterol Cal: 50 mg/dL — ABNORMAL HIGH (ref 5–40)

## 2022-01-12 MED ORDER — ATORVASTATIN CALCIUM 20 MG PO TABS
20.0000 mg | ORAL_TABLET | Freq: Every day | ORAL | 3 refills | Status: DC
  Filled 2022-01-12: qty 90, 90d supply, fill #0

## 2022-01-16 ENCOUNTER — Other Ambulatory Visit: Payer: Self-pay

## 2022-01-17 ENCOUNTER — Other Ambulatory Visit: Payer: Self-pay

## 2022-04-19 ENCOUNTER — Ambulatory Visit: Payer: Self-pay | Admitting: Nurse Practitioner

## 2022-05-23 ENCOUNTER — Ambulatory Visit: Payer: Self-pay | Admitting: Nurse Practitioner

## 2022-06-12 ENCOUNTER — Ambulatory Visit: Payer: Self-pay | Admitting: Nurse Practitioner

## 2022-07-25 ENCOUNTER — Encounter: Payer: Self-pay | Admitting: Nurse Practitioner

## 2022-07-25 ENCOUNTER — Ambulatory Visit (INDEPENDENT_AMBULATORY_CARE_PROVIDER_SITE_OTHER): Payer: Self-pay | Admitting: Nurse Practitioner

## 2022-07-25 VITALS — BP 163/78 | HR 74 | Temp 98.2°F | Ht 74.0 in | Wt 284.0 lb

## 2022-07-25 DIAGNOSIS — I10 Essential (primary) hypertension: Secondary | ICD-10-CM

## 2022-07-25 DIAGNOSIS — Z23 Encounter for immunization: Secondary | ICD-10-CM

## 2022-07-25 DIAGNOSIS — F321 Major depressive disorder, single episode, moderate: Secondary | ICD-10-CM

## 2022-07-25 DIAGNOSIS — G8929 Other chronic pain: Secondary | ICD-10-CM

## 2022-07-25 DIAGNOSIS — Z716 Tobacco abuse counseling: Secondary | ICD-10-CM

## 2022-07-25 DIAGNOSIS — M25562 Pain in left knee: Secondary | ICD-10-CM

## 2022-07-25 DIAGNOSIS — M25561 Pain in right knee: Secondary | ICD-10-CM

## 2022-07-25 MED ORDER — CYCLOBENZAPRINE HCL 5 MG PO TABS: 5.0000 mg | ORAL_TABLET | Freq: Three times a day (TID) | ORAL | 1 refills | Status: DC | PRN

## 2022-07-25 MED ORDER — METHYLPREDNISOLONE 4 MG PO TBPK: ORAL_TABLET | ORAL | 0 refills | Status: DC

## 2022-07-25 MED ORDER — IBUPROFEN 800 MG PO TABS: 800.0000 mg | ORAL_TABLET | Freq: Three times a day (TID) | ORAL | 0 refills | Status: DC | PRN

## 2022-07-25 MED ORDER — AMLODIPINE BESYLATE 2.5 MG PO TABS: 2.5000 mg | ORAL_TABLET | Freq: Every day | ORAL | 0 refills | Status: DC

## 2022-07-25 MED ORDER — ATORVASTATIN CALCIUM 20 MG PO TABS: 20.0000 mg | ORAL_TABLET | Freq: Every day | ORAL | 0 refills | Status: DC

## 2022-07-25 MED ORDER — KETOROLAC TROMETHAMINE 60 MG/2ML IM SOLN
60.0000 mg | Freq: Once | INTRAMUSCULAR | Status: AC
  Administered 2022-07-25: 60 mg via INTRAMUSCULAR

## 2022-07-25 NOTE — Assessment & Plan Note (Signed)
Smokes about less than 0.5 pack/day  Asked about quitting: confirms that he/she currently smokes cigarettes Advise to quit smoking: Educated about QUITTING to reduce the risk of cancer, cardio and cerebrovascular disease. Assess willingness: Unwilling to quit at this time, but is working on cutting back. Assist with counseling and pharmacotherapy: Counseled for 5 minutes and literature provided. Arrange for follow up: follow up with PCP in 3-6 months and continue to offer help.

## 2022-07-25 NOTE — Assessment & Plan Note (Signed)
BP Readings from Last 3 Encounters:  07/25/22 (!) 163/78  01/11/22 119/77  05/02/21 (!) 154/73  Blood pressure is elevated in the office today Patient started on amlodipine 2.5 mg daily Blood pressure goal is less than 140/90 Monitor blood pressure at least 3 times weekly at home Follow-up with PCP in 4 weeks Patient counseled on DASH diet was encouraged to engage in regular moderate exercises at least moderate and 50 minutes weekly as tolerated

## 2022-07-25 NOTE — Progress Notes (Addendum)
New Patient Office Visit  Subjective:  Patient ID: Alex [name], male    DOB: 03/28/1966  Age: 56 y.o. MRN: [id number]  CC:  Chief Complaint  Patient presents with   Knee Pain    Pt stated--both knee--having a lot of pain, swollen--worse when standing, squatting--7 years.    HPI Alex [name] is a 56 y.o. male with past medical history of bilateral knee pain, hypertension, hyperlipidemia, tobaccos abuse presents with complaints of bilateral knee pain.   Bilateral knee pain, going on for the past 5 years , has bilateral joint stiffness in the morning when he wakes up , hears a popping sound with ROM of bilateral knee, he has been taking ibuprofen but the medication has not been helpful.  His pain is worse with standing, squatting. he denies fever, chills, numbness, tingling , malaise. He currently has aching pain rated 8/10.   Depression. Patient stated that he was in rehab for cocaine use in 2017. He stooped using cocaine until June 2023 when he did a little cocaine again. He has been feeling bad about this. He denies SI, HI. Refused referral for counseling and medication.   Hypertension. Was previously on amlodipine but he was taken off the medication because his blood pressure became normal. He denies SOB, CP , dizziness.   He has been out of atorvastatin and he need a refill today .   Tobacco abuse . smokes less than half a pack of cigarettes daily , he denies SOB, wheezing, cough, patient counselled on smoking cessation.   Patient on Zelda NP  Past Medical History:  Diagnosis Date   Bone spur    Flu    Hypertension    Kidney stone    Prediabetes     Past Surgical History:  Procedure Laterality Date   ELBOW SURGERY  2019    Family History  Problem Relation Age of Onset   Hypertension Mother    Kidney disease Mother    Heart disease Mother    Cancer Father     Social History   Socioeconomic History   Marital status: Married    Spouse name: Not on file    Number of children: Not on file   Years of education: Not on file   Highest education level: Not on file  Occupational History   Not on file  Tobacco Use   Smoking status: Every Day    Packs/day: 0.10    Types: Cigarettes    Last attempt to quit: 02/16/2017    Years since quitting: 5.4   Smokeless tobacco: Never  Vaping Use   Vaping Use: Never used  Substance and Sexual Activity   Alcohol use: No    Comment: former quit January 2018   Drug use: No    Types: Cocaine    Comment: last use in April 2018   Sexual activity: Yes  Other Topics Concern   Not on file  Social History Narrative   Not on file   Social Determinants of Health   Financial Resource Strain: Not on file  Food Insecurity: Not on file  Transportation Needs: Not on file  Physical Activity: Not on file  Stress: Not on file  Social Connections: Not on file  Intimate Partner Violence: Not on file    ROS Review of Systems  Constitutional:  Negative for activity change, appetite change, chills, diaphoresis and fatigue.  Respiratory: Negative.  Negative for apnea, choking, wheezing and stridor.   Cardiovascular: Negative.  Negative for  chest pain, palpitations and leg swelling.  Gastrointestinal:  Negative for abdominal distention, abdominal pain, anal bleeding and blood in stool.  Genitourinary:  Negative for dysuria, flank pain, frequency and genital sores.  Musculoskeletal:  Positive for arthralgias and joint swelling. Negative for back pain, gait problem, myalgias and neck pain.  Skin: Negative.   Neurological:  Negative for dizziness, seizures, facial asymmetry, light-headedness, numbness and headaches.  Psychiatric/Behavioral:  Negative for agitation, behavioral problems, confusion, decreased concentration, self-injury and suicidal ideas.     Objective:   Today's Vitals: BP (!) 163/78   Pulse 74   Temp 98.2 F (36.8 C)   Ht 6\' 2"  (1.88 m)   Wt 284 lb (128.8 kg)   SpO2 98%   BMI 36.46 kg/m    Physical Exam Constitutional:      General: He is not in acute distress.    Appearance: He is obese. He is not ill-appearing, toxic-appearing or diaphoretic.  Eyes:     General: No scleral icterus.       Left eye: No discharge.     Extraocular Movements: Extraocular movements intact.     Conjunctiva/sclera: Conjunctivae normal.  Cardiovascular:     Rate and Rhythm: Normal rate and regular rhythm.     Pulses: Normal pulses.     Heart sounds: Normal heart sounds. No murmur heard.    No friction rub. No gallop.  Pulmonary:     Effort: Pulmonary effort is normal. No respiratory distress.     Breath sounds: Normal breath sounds. No stridor. No wheezing, rhonchi or rales.  Chest:     Chest wall: No tenderness.  Abdominal:     General: There is no distension.     Palpations: Abdomen is soft.     Tenderness: There is no abdominal tenderness. There is no guarding.  Musculoskeletal:        General: Tenderness present. No swelling.     Right lower leg: No edema.     Left lower leg: No edema.     Comments: Tenderness on range of motion of bilateral knee.  Skin warm and dry, no swelling or redness noted.  Sensation intact. Crepitus noted on ROM of bilateral knees   Skin:    General: Skin is warm and dry.     Capillary Refill: Capillary refill takes less than 2 seconds.  Neurological:     Mental Status: He is alert and oriented to person, place, and time.     Cranial Nerves: No cranial nerve deficit.     Motor: No weakness.     Coordination: Coordination normal.  Psychiatric:        Mood and Affect: Mood normal.        Behavior: Behavior normal.        Thought Content: Thought content normal.        Judgment: Judgment normal.     Assessment & Plan:   Problem List Items Addressed This Visit       Cardiovascular and Mediastinum   Essential hypertension    BP Readings from Last 3 Encounters:  07/25/22 (!) 163/78  01/11/22 119/77  05/02/21 (!) 154/73  Blood pressure is  elevated in the office today Patient started on amlodipine 2.5 mg daily Blood pressure goal is less than 140/90 Monitor blood pressure at least 3 times weekly at home Follow-up with PCP in 4 weeks Patient counseled on DASH diet was encouraged to engage in regular moderate exercises at least moderate and 50 minutes weekly as tolerated  Relevant Medications   amLODipine (NORVASC) 2.5 MG tablet   atorvastatin (LIPITOR) 20 MG tablet     Other   Chronic pain of both knees - Primary      given toradol 60mg  injection today in the office , start  methylPREDNISolone (MEDROL DOSEPAK) 4 MG TBPK tablet; Take as instructed on the packaging  Dispense: 1 each; Refill: 0 - cyclobenzaprine (FLEXERIL) 5 MG tablet; Take 1 tablet (5 mg total) by mouth 3 (three) times daily as needed for muscle spasms.  Dispense: 30 tablet; Refill: 1   After completion of the steroid start taking ibuprofen as needed , please alternate with tyenol 650 mg every 6 hors as needed, take ibuprofen and steroid with meals.   - ibuprofen (ADVIL) 800 MG tablet; Take 1 tablet (800 mg total) by mouth every 8 (eight) hours as needed.  Dispense: 30 tablet; Refill: 0 Patient declined referral to orthopedics also declined x-ray      Relevant Medications   ibuprofen (ADVIL) 800 MG tablet   cyclobenzaprine (FLEXERIL) 5 MG tablet   methylPREDNISolone (MEDROL DOSEPAK) 4 MG TBPK tablet   Moderate major depression (HCC)       07/25/2022    2:07 PM 01/11/2022    2:16 PM 05/02/2021    8:30 AM 06/15/2020    8:47 AM 03/17/2020    9:16 AM  Depression screen PHQ 2/9  Decreased Interest 0 0 0 0 0  Down, Depressed, Hopeless 3 0 0 0 0  PHQ - 2 Score 3 0 0 0 0  Altered sleeping 3   0 0  Tired, decreased energy 3   0 0  Change in appetite 3   0 0  Feeling bad or failure about yourself  0   0 0  Trouble concentrating 0   0 0  Moving slowly or fidgety/restless 0   0 0  Suicidal thoughts 0   0 0  PHQ-9 Score 12   0 0  Difficult doing  work/chores Not difficult at all      Currently denies SI, HI He declined referral for counseling, refused medication      Tobacco abuse counseling    Smokes about less than 0.5 pack/day  Asked about quitting: confirms that he/she currently smokes cigarettes Advise to quit smoking: Educated about QUITTING to reduce the risk of cancer, cardio and cerebrovascular disease. Assess willingness: Unwilling to quit at this time, but is working on cutting back. Assist with counseling and pharmacotherapy: Counseled for 5 minutes and literature provided. Arrange for follow up: follow up with PCP in 3-6 months and continue to offer help.       Other Visit Diagnoses     Need for immunization against influenza       Relevant Orders   Flu Vaccine QUAD 24mo+IM (Fluarix, Fluzone & Alfiuria Quad PF) (Completed)       Outpatient Encounter Medications as of 07/25/2022  Medication Sig   amLODipine (NORVASC) 2.5 MG tablet Take 1 tablet (2.5 mg total) by mouth daily.   cyclobenzaprine (FLEXERIL) 5 MG tablet Take 1 tablet (5 mg total) by mouth 3 (three) times daily as needed for muscle spasms.   ibuprofen (ADVIL) 800 MG tablet Take 1 tablet (800 mg total) by mouth every 8 (eight) hours as needed.   methylPREDNISolone (MEDROL DOSEPAK) 4 MG TBPK tablet Take as instructed on the packaging   atorvastatin (LIPITOR) 20 MG tablet Take 1 tablet (20 mg total) by mouth daily.   diclofenac Sodium (VOLTAREN) 1 %  GEL APPLY 4GM TO LEFT SHOULDER FOUR TIMES A DAY AS NEEDED FOR PAIN (Patient not taking: Reported on 07/25/2022)   fluticasone (FLONASE) 50 MCG/ACT nasal spray PLACE 2 SPRAYS INTO BOTH NOSTRILS DAILY.   meloxicam (MOBIC) 15 MG tablet Take 1 tablet (15 mg total) by mouth daily. (Patient not taking: Reported on 07/25/2022)   potassium chloride SA (KLOR-CON) 20 MEQ tablet Take by mouth. (Patient not taking: Reported on 07/25/2022)   [DISCONTINUED] atorvastatin (LIPITOR) 20 MG tablet Take 1 tablet (20 mg total) by  mouth daily. (Patient not taking: Reported on 07/25/2022)   [EXPIRED] ketorolac (TORADOL) injection 60 mg    No facility-administered encounter medications on file as of 07/25/2022.    Follow-up: No follow-ups on file.   Donell Beers, FNP

## 2022-07-25 NOTE — Patient Instructions (Signed)
Please follow up with your PCP in 4 weeks for your blood pressure     Chronic pain of both knees  You were given toradol injection today in the office , start  methylPREDNISolone (MEDROL DOSEPAK) 4 MG TBPK tablet; Take as instructed on the packaging  Dispense: 1 each; Refill: 0 - cyclobenzaprine (FLEXERIL) 5 MG tablet; Take 1 tablet (5 mg total) by mouth 3 (three) times daily as needed for muscle spasms.  Dispense: 30 tablet; Refill: 1   After completion of the steroid start taking ibuprofen as needed , please alternate with tyenol 650 mg every 6 hors as needed, take ibuprofen and steroid with meals.   - ibuprofen (ADVIL) 800 MG tablet; Take 1 tablet (800 mg total) by mouth every 8 (eight) hours as needed.  Dispense: 30 tablet; Refill: 0  - . Essential hypertension  - amLODipine (NORVASC) 2.5 MG tablet; Take 1 tablet (2.5 mg total) by mouth daily.  Dispense: 90 tablet; Refill: 0 Around 3 times per week, check your blood pressure 2 times per day. once in the morning and once in the evening. The readings should be at least one minute apart. Write down these values and bring them to your next nurse visit/appointment.  When you check your BP, make sure you have been doing something calm/relaxing 5 minutes prior to checking. Both feet should be flat on the floor and you should be sitting. Use your left arm and make sure it is in a relaxed position (on a table), and that the cuff is at the approximate level/height of your heart.

## 2022-07-25 NOTE — Assessment & Plan Note (Signed)
   given toradol 60mg  injection today in the office , start  methylPREDNISolone (MEDROL DOSEPAK) 4 MG TBPK tablet; Take as instructed on the packaging  Dispense: 1 each; Refill: 0 - cyclobenzaprine (FLEXERIL) 5 MG tablet; Take 1 tablet (5 mg total) by mouth 3 (three) times daily as needed for muscle spasms.  Dispense: 30 tablet; Refill: 1   After completion of the steroid start taking ibuprofen as needed , please alternate with tyenol 650 mg every 6 hors as needed, take ibuprofen and steroid with meals.   - ibuprofen (ADVIL) 800 MG tablet; Take 1 tablet (800 mg total) by mouth every 8 (eight) hours as needed.  Dispense: 30 tablet; Refill: 0 Patient declined referral to orthopedics also declined x-ray

## 2022-07-25 NOTE — Assessment & Plan Note (Signed)
    07/25/2022    2:07 PM 01/11/2022    2:16 PM 05/02/2021    8:30 AM 06/15/2020    8:47 AM 03/17/2020    9:16 AM  Depression screen PHQ 2/9  Decreased Interest 0 0 0 0 0  Down, Depressed, Hopeless 3 0 0 0 0  PHQ - 2 Score 3 0 0 0 0  Altered sleeping 3   0 0  Tired, decreased energy 3   0 0  Change in appetite 3   0 0  Feeling bad or failure about yourself  0   0 0  Trouble concentrating 0   0 0  Moving slowly or fidgety/restless 0   0 0  Suicidal thoughts 0   0 0  PHQ-9 Score 12   0 0  Difficult doing work/chores Not difficult at all      Currently denies SI, HI He declined referral for counseling, refused medication

## 2022-08-06 ENCOUNTER — Encounter (HOSPITAL_COMMUNITY): Payer: Self-pay

## 2022-08-06 ENCOUNTER — Ambulatory Visit (HOSPITAL_COMMUNITY)
Admission: EM | Admit: 2022-08-06 | Discharge: 2022-08-06 | Disposition: A | Discharge status: Home / Self Care | Payer: Self-pay | Attending: Emergency Medicine | Admitting: Emergency Medicine

## 2022-08-06 DIAGNOSIS — S66312A Strain of extensor muscle, fascia and tendon of right middle finger at wrist and hand level, initial encounter: Secondary | ICD-10-CM

## 2022-08-06 DIAGNOSIS — Z23 Encounter for immunization: Secondary | ICD-10-CM

## 2022-08-06 DIAGNOSIS — S60511A Abrasion of right hand, initial encounter: Secondary | ICD-10-CM

## 2022-08-06 DIAGNOSIS — S6991XA Unspecified injury of right wrist, hand and finger(s), initial encounter: Secondary | ICD-10-CM

## 2022-08-06 MED ORDER — IBUPROFEN 800 MG PO TABS: 800.0000 mg | ORAL_TABLET | Freq: Two times a day (BID) | ORAL | 0 refills | Status: DC

## 2022-08-06 MED ORDER — TETANUS-DIPHTH-ACELL PERTUSSIS 5-2.5-18.5 LF-MCG/0.5 IM SUSY
PREFILLED_SYRINGE | INTRAMUSCULAR | Status: AC
  Filled 2022-08-06: qty 0.5

## 2022-08-06 MED ORDER — TETANUS-DIPHTH-ACELL PERTUSSIS 5-2.5-18.5 LF-MCG/0.5 IM SUSY
0.5000 mL | PREFILLED_SYRINGE | Freq: Once | INTRAMUSCULAR | Status: AC
  Administered 2022-08-06: 0.5 mL via INTRAMUSCULAR

## 2022-08-06 NOTE — ED Triage Notes (Signed)
Pt injured right hand at work causing pain and swelling yesterday

## 2022-08-06 NOTE — Discharge Instructions (Signed)
We placed a finger splint in office today for stability and to aid with healing.  We updated your Tdap shot today.   As discussed, ibuprofen 800 mg has been sent to the pharmacy, you can take this 2 times daily, can help with the inflammation.   Please follow-up if your symptoms are not improving.

## 2022-08-06 NOTE — ED Provider Notes (Signed)
[facility]    CSN: [id number] Arrival date & time: 08/06/22  1007      History   Chief Complaint Chief Complaint  Patient presents with   Hand Injury    HPI Alex [name] is a 56 y.o. male.  Patient presents complaining of right middle finger pain and right hand injury that started yesterday.  He denies any trauma to his right hand that he remembers. He states that he works with his hands, he noticed that some material scratch part of his hand yesterday and he may have strained his hand while working.  He reports having an abrasion to part of his right hand. He reports right middle finger swelling away from where the abrasion occurred.  He denies any drainage or redness present at site of abrasion.  He denies any numbness or tingling in his right upper extremity.  He denies any previous injury to his right hand in the past. Patient states that he has not taken any medicines for symptoms.  He reports that his last Tdap injection has been over 10 years ago.    Hand Injury   Past Medical History:  Diagnosis Date   Bone spur    Flu    Hypertension    Kidney stone    Prediabetes     Patient Active Problem List   Diagnosis Date Noted   Moderate major depression (HCC) 07/25/2022   Tobacco abuse counseling 07/25/2022   Neuropathy 04/11/2017   Essential hypertension 04/11/2017   Chronic pain of both knees 04/11/2017   Right hand pain 04/11/2017   Pain of right heel 04/11/2017    Past Surgical History:  Procedure Laterality Date   ELBOW SURGERY  2019       Home Medications    Prior to Admission medications   Medication Sig Start Date End Date Taking? Authorizing Provider  ibuprofen (ADVIL) 800 MG tablet Take 1 tablet (800 mg total) by mouth in the morning and at bedtime. 08/06/22  Yes Debby Freiberg, NP  amLODipine (NORVASC) 2.5 MG tablet Take 1 tablet (2.5 mg total) by mouth daily. 07/25/22   Paseda, Baird Kay, FNP  atorvastatin (LIPITOR) 20 MG  tablet Take 1 tablet (20 mg total) by mouth daily. 07/25/22   Paseda, Baird Kay, FNP  cyclobenzaprine (FLEXERIL) 5 MG tablet Take 1 tablet (5 mg total) by mouth 3 (three) times daily as needed for muscle spasms. 07/25/22   Paseda, Baird Kay, FNP  fluticasone (FLONASE) 50 MCG/ACT nasal spray PLACE 2 SPRAYS INTO BOTH NOSTRILS DAILY. 06/15/20 06/15/21  Claiborne Rigg, NP  ibuprofen (ADVIL) 800 MG tablet Take 1 tablet (800 mg total) by mouth every 8 (eight) hours as needed. 07/25/22   Donell Beers, FNP  methylPREDNISolone (MEDROL DOSEPAK) 4 MG TBPK tablet Take as instructed on the packaging 07/25/22   Donell Beers, FNP    Family History Family History  Problem Relation Age of Onset   Hypertension Mother    Kidney disease Mother    Heart disease Mother    Cancer Father     Social History Social History   Tobacco Use   Smoking status: Every Day    Packs/day: 0.10    Types: Cigarettes    Last attempt to quit: 02/16/2017    Years since quitting: 5.4   Smokeless tobacco: Never  Vaping Use   Vaping Use: Never used  Substance Use Topics   Alcohol use: No    Comment: former quit January 2018  Drug use: No    Types: Cocaine    Comment: last use in April 2018     Allergies   Patient has no known allergies.   Review of Systems Review of Systems Per HPI   Physical Exam Triage Vital Signs ED Triage Vitals  Enc Vitals Group     BP 08/06/22 1035 (!) 166/81     Pulse Rate 08/06/22 1035 75     Resp 08/06/22 1035 12     Temp 08/06/22 1035 98 F (36.7 C)     Temp Source 08/06/22 1035 Oral     SpO2 08/06/22 1035 95 %     Weight --      Height --      Head Circumference --      Peak Flow --      Pain Score 08/06/22 1033 7     Pain Loc --      Pain Edu? --      Excl. in GC? --    No data found.  Updated Vital Signs BP (!) 166/81 (BP Location: Right Arm)   Pulse 75   Temp 98 F (36.7 C) (Oral)   Resp 12   SpO2 95%   Physical Exam Vitals and nursing  note reviewed.  Constitutional:      Appearance: Normal appearance.  Cardiovascular:     Pulses:          Radial pulses are 2+ on the right side.  Musculoskeletal:     Right hand: Swelling (Swelling to dorsal and palmar sided of middle finger) present. No lacerations, tenderness or bony tenderness. Decreased range of motion (Difficulty with flexion). Normal strength. Normal sensation. Normal capillary refill. Normal pulse.     Comments: Approximately 1 cm abrasion in length located on the proximal palmar surface of RT hand.  No erythema, no tenderness upon palpation, and no drainage from site.   Skin:    Capillary Refill: Capillary refill takes less than 2 seconds.  Neurological:     Mental Status: He is alert.      UC Treatments / Results  Labs (all labs ordered are listed, but only abnormal results are displayed) Labs Reviewed - No data to display  EKG   Radiology No results found.  Procedures Procedures (including critical care time)  Medications Ordered in UC Medications  Tdap (BOOSTRIX) injection 0.5 mL (0.5 mLs Intramuscular Given 08/06/22 1139)    Initial Impression / Assessment and Plan / UC Course  I have reviewed the triage vital signs and the nursing notes.  Pertinent labs & imaging results that were available during my care of the patient were reviewed by me and considered in my medical decision making (see chart for details).     Patient was evaluated for was evaluated for strain of right middle finger and abrasion to right hand.  No signs of infection noted around abrasion.  Etiology of symptoms with middle finger appear to be related to a strain.  Shared decision-making was used to determine that no imaging needed to be performed at this time due to no apparent trauma to the right hand.  Tdap was updated. A splint was placed on finger for compression and to aid with rest.  A refill and ibuprofen 800 was sent to the pharmacy, patient was made aware to use  this to help with inflammation.  Patient was made aware to follow-up if symptoms or not improving.  Patient verbalized understanding of instructions.   Charting was provided using  a a verbal dictation system, charting was proofread for errors, errors may occur which could change the meaning of the information charted.   Final Clinical Impressions(s) / UC Diagnoses   Final diagnoses:  Strain of extensor structure of right middle finger at hand level  Injury of right hand, initial encounter  Abrasion of right hand, initial encounter     Discharge Instructions      We placed a finger splint in office today for stability and to aid with healing.  We updated your Tdap shot today.   As discussed, ibuprofen 800 mg has been sent to the pharmacy, you can take this 2 times daily, can help with the inflammation.   Please follow-up if your symptoms are not improving.      ED Prescriptions     Medication Sig Dispense Auth. Provider   ibuprofen (ADVIL) 800 MG tablet Take 1 tablet (800 mg total) by mouth in the morning and at bedtime. 26 tablet Debby Freiberg, NP      PDMP not reviewed this encounter.   Debby Freiberg, NP 08/06/22 1152

## 2022-08-23 ENCOUNTER — Ambulatory Visit: Payer: Self-pay | Admitting: Nurse Practitioner

## 2022-08-24 ENCOUNTER — Telehealth: Payer: Self-pay | Admitting: Nurse Practitioner

## 2022-08-24 ENCOUNTER — Other Ambulatory Visit: Payer: Self-pay

## 2022-08-24 DIAGNOSIS — J320 Chronic maxillary sinusitis: Secondary | ICD-10-CM

## 2022-08-24 MED ORDER — FLUTICASONE PROPIONATE 50 MCG/ACT NA SUSP
2.0000 | Freq: Every day | NASAL | 0 refills | Status: DC
  Filled 2022-08-24: qty 16, 30d supply, fill #0

## 2022-08-24 NOTE — Telephone Encounter (Signed)
Caller & Relationship to patient:  MRN #  [id number]   Call Back Number:   Date of Last Office Visit: 07/25/2022     Date of Next Office Visit: 09/06/2022    Medication(s) to be Refilled: Sinus med's refilled  Preferred Pharmacy:   ** Please notify patient to allow 48-72 hours to process** **Let patient know to contact pharmacy at the end of the day to make sure medication is ready. ** **If patient has not been seen in a year or longer, book an appointment **Advise to use MyChart for refill requests OR to contact their pharmacy

## 2022-08-25 ENCOUNTER — Ambulatory Visit: Payer: Self-pay | Admitting: Nurse Practitioner

## 2022-08-30 ENCOUNTER — Other Ambulatory Visit: Payer: Self-pay

## 2022-09-06 ENCOUNTER — Ambulatory Visit (INDEPENDENT_AMBULATORY_CARE_PROVIDER_SITE_OTHER): Payer: Self-pay | Admitting: Nurse Practitioner

## 2022-09-06 ENCOUNTER — Encounter: Payer: Self-pay | Admitting: Nurse Practitioner

## 2022-09-06 VITALS — BP 147/64 | HR 68 | Ht 73.0 in | Wt 286.0 lb

## 2022-09-06 DIAGNOSIS — M25562 Pain in left knee: Secondary | ICD-10-CM

## 2022-09-06 DIAGNOSIS — R7303 Prediabetes: Secondary | ICD-10-CM

## 2022-09-06 DIAGNOSIS — G8929 Other chronic pain: Secondary | ICD-10-CM

## 2022-09-06 DIAGNOSIS — J329 Chronic sinusitis, unspecified: Secondary | ICD-10-CM

## 2022-09-06 DIAGNOSIS — I10 Essential (primary) hypertension: Secondary | ICD-10-CM

## 2022-09-06 DIAGNOSIS — M25561 Pain in right knee: Secondary | ICD-10-CM

## 2022-09-06 DIAGNOSIS — S66312A Strain of extensor muscle, fascia and tendon of right middle finger at wrist and hand level, initial encounter: Secondary | ICD-10-CM

## 2022-09-06 LAB — POCT GLYCOSYLATED HEMOGLOBIN (HGB A1C): HbA1c, POC (prediabetic range): 6.3 % (ref 5.7–6.4)

## 2022-09-06 MED ORDER — IBUPROFEN 800 MG PO TABS: 800.0000 mg | ORAL_TABLET | Freq: Two times a day (BID) | ORAL | 0 refills | Status: DC | PRN

## 2022-09-06 MED ORDER — FLUTICASONE PROPIONATE 50 MCG/ACT NA SUSP: 2.0000 | Freq: Every day | NASAL | 0 refills | Status: AC

## 2022-09-06 MED ORDER — CYCLOBENZAPRINE HCL 5 MG PO TABS: 5.0000 mg | ORAL_TABLET | Freq: Three times a day (TID) | ORAL | 1 refills | Status: DC | PRN

## 2022-09-06 MED ORDER — IBUPROFEN 800 MG PO TABS: 800.0000 mg | ORAL_TABLET | Freq: Two times a day (BID) | ORAL | 0 refills | Status: DC

## 2022-09-06 NOTE — Patient Instructions (Signed)
1. Chronic maxillary sinusitis  - fluticasone (FLONASE) 50 MCG/ACT nasal spray; PLACE 2 SPRAYS INTO BOTH NOSTRILS DAILY.  Dispense: 16 g; Refill: 0  2. Chronic pain of both knees  - cyclobenzaprine (FLEXERIL) 5 MG tablet; Take 1 tablet (5 mg total) by mouth 3 (three) times daily as needed for muscle spasms.  Dispense: 30 tablet; Refill: 1 - ibuprofen (ADVIL) 800 MG tablet; Take 1 tablet (800 mg total) by mouth 2 (two) times daily as needed.  Dispense: 20 tablet; Refill: 0  3. Strain of extensor structure of right middle finger at hand level  - cyclobenzaprine (FLEXERIL) 5 MG tablet; Take 1 tablet (5 mg total) by mouth 3 (three) times daily as needed for muscle spasms.  Dispense: 30 tablet; Refill: 1 - ibuprofen (ADVIL) 800 MG tablet; Take 1 tablet (800 mg total) by mouth 2 (two) times daily as needed.  Dispense: 20 tablet; Refill: 0  Your blood pressure goal is less than 140/90   It is important that you exercise regularly at least 30 minutes 5 times a week as tolerated  Think about what you will eat, plan ahead. Choose " clean, green, fresh or frozen" over canned, processed or packaged foods which are more sugary, salty and fatty. 70 to 75% of food eaten should be vegetables and fruit. Three meals at set times with snacks allowed between meals, but they must be fruit or vegetables. Aim to eat over a 12 hour period , example 7 am to 7 pm, and STOP after  your last meal of the day. Drink water,generally about 64 ounces per day, no other drink is as healthy. Fruit juice is best enjoyed in a healthy way, by EATING the fruit.  Thanks for choosing [facility] we consider it a privelige to serve you.

## 2022-09-06 NOTE — Assessment & Plan Note (Signed)
Lab Results  Component Value Date   HGBA1C 6.3 09/06/2022  Avoid sugar sweets soda Engage in regular moderate to vigorous exercises at least 150 minutes weekly as tolerated

## 2022-09-06 NOTE — Assessment & Plan Note (Addendum)
Patient complains of stuffy nose, sneezing congestion, no complaints of fever, chills, shortness of breath.  He has not been taking Flonase Flonase nasal spray 2 spray daily into both nares reordered.

## 2022-09-06 NOTE — Progress Notes (Signed)
Established Patient Office Visit  Subjective:  Patient ID: Alex [name], male    DOB: Sep 23, 1965  Age: 57 y.o. MRN: [id number]  CC:  Chief Complaint  Patient presents with   Follow-up    Right hand is swollen and painful. Mostly in middle finger but when clenching its down into hand, unable to close tight    HPI  Alex [name] is a 58 y.o. male with past medical history of bilateral knee pain, hypertension, hyperlipidemia, tobaccos abuse presents for follow up for hypertension.     Hypertension.  Patient was started on amlodipine 2.5 mg daily about 6 weeks ago , he reports that he has been taking the medication daily as ordered however he did not take it this morning ,gets a lot walking now, reports that his diet can be better. He denies CP, dizziness, edema, syncope.  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  He was seen at the emergency room about 4 weeks ago for complaints of right middle finger pain, he has been taking ibuprofen as needed.  He denies numbness, tingling.    Past Medical History:  Diagnosis Date   Bone spur    Flu    Hypertension    Kidney stone    Prediabetes     Past Surgical History:  Procedure Laterality Date   ELBOW SURGERY  2019    Family History  Problem Relation Age of Onset   Hypertension Mother    Kidney disease Mother    Heart disease Mother    Cancer Father     Social History   Socioeconomic History   Marital status: Married    Spouse name: Not on file   Number of children: Not on file   Years of education: Not on file   Highest education level: Not on file  Occupational History   Not on file  Tobacco Use   Smoking status: Every Day    Packs/day: 0.10    Types: Cigarettes    Last attempt to quit: 02/16/2017    Years since quitting: 5.5   Smokeless  tobacco: Never  Vaping Use   Vaping Use: Never used  Substance and Sexual Activity   Alcohol use: No    Comment: former quit January 2018   Drug use: No    Types: Cocaine    Comment: last use in April 2018   Sexual activity: Yes  Other Topics Concern   Not on file  Social History Narrative   Not on file   Social Determinants of Health   Financial Resource Strain: Not on file  Food Insecurity: Not on file  Transportation Needs: Not on file  Physical Activity: Not on file  Stress: Not on file  Social Connections: Not on file  Intimate Partner Violence: Not on file    Outpatient Medications Prior to Visit  Medication Sig Dispense Refill   amLODipine (NORVASC) 2.5 MG tablet Take 1 tablet (2.5 mg total) by mouth daily. 90 tablet 0   atorvastatin (LIPITOR) 20 MG tablet Take 1 tablet (20 mg total) by mouth daily. 90 tablet 0   cyclobenzaprine (FLEXERIL) 5 MG tablet Take 1 tablet (5 mg total) by mouth 3 (three) times daily as needed for muscle spasms. 30 tablet 1   fluticasone (FLONASE) 50  MCG/ACT nasal spray PLACE 2 SPRAYS INTO BOTH NOSTRILS DAILY. 16 g 0   ibuprofen (ADVIL) 800 MG tablet Take 1 tablet (800 mg total) by mouth every 8 (eight) hours as needed. 30 tablet 0   ibuprofen (ADVIL) 800 MG tablet Take 1 tablet (800 mg total) by mouth in the morning and at bedtime. 26 tablet 0   methylPREDNISolone (MEDROL DOSEPAK) 4 MG TBPK tablet Take as instructed on the packaging 1 each 0   No facility-administered medications prior to visit.    No Known Allergies  ROS Review of Systems  Constitutional:  Negative for activity change, appetite change, chills, diaphoresis, fatigue, fever and unexpected weight change.  HENT:  Positive for congestion and sneezing. Negative for dental problem, drooling, ear discharge, ear pain, facial swelling, tinnitus and trouble swallowing.   Respiratory: Negative.  Negative for apnea, cough, choking, chest tightness, shortness of breath, wheezing and  stridor.   Cardiovascular: Negative.  Negative for chest pain, palpitations and leg swelling.  Gastrointestinal: Negative.  Negative for abdominal distention, abdominal pain, anal bleeding and blood in stool.  Musculoskeletal:  Positive for arthralgias and myalgias.  Neurological: Negative.  Negative for dizziness, seizures, facial asymmetry, light-headedness, numbness and headaches.  Psychiatric/Behavioral:  Negative for agitation, behavioral problems, confusion and decreased concentration.       Objective:    Physical Exam Constitutional:      General: He is not in acute distress.    Appearance: He is obese. He is not ill-appearing, toxic-appearing or diaphoretic.  Eyes:     General: No scleral icterus.       Right eye: No discharge.        Left eye: No discharge.     Extraocular Movements: Extraocular movements intact.  Cardiovascular:     Rate and Rhythm: Normal rate and regular rhythm.     Pulses: Normal pulses.     Heart sounds: Normal heart sounds. No murmur heard.    No friction rub. No gallop.  Pulmonary:     Effort: Pulmonary effort is normal. No respiratory distress.     Breath sounds: Normal breath sounds. No stridor. No wheezing, rhonchi or rales.  Chest:     Chest wall: No tenderness.  Abdominal:     General: There is no distension.     Palpations: Abdomen is soft.     Tenderness: There is no abdominal tenderness.  Musculoskeletal:        General: Tenderness present. No deformity or signs of injury.     Comments: Tenderness on ROM of right middle finger, able to flex all fingers and make a fist, no swelling or redness noted. Has palpable radial pulses.    Skin:    General: Skin is warm and dry.  Neurological:     Mental Status: He is alert and oriented to person, place, and time.     Sensory: No sensory deficit.     Motor: No weakness.     Gait: Gait normal.  Psychiatric:        Mood and Affect: Mood normal.        Behavior: Behavior normal.         Thought Content: Thought content normal.        Judgment: Judgment normal.     BP (!) 147/64   Pulse 68   Ht 6\' 1"  (1.854 m)   Wt 286 lb (129.7 kg)   SpO2 99%   BMI 37.73 kg/m  Wt Readings from Last 3 Encounters:  09/06/22  286 lb (129.7 kg)  07/25/22 284 lb (128.8 kg)  01/11/22 292 lb 6.4 oz (132.6 kg)    Lab Results  Component Value Date   TSH 0.738 06/15/2020   Lab Results  Component Value Date   WBC 6.6 01/11/2022   HGB 15.3 01/11/2022   HCT 45.1 01/11/2022   MCV 87 01/11/2022   PLT 252 01/11/2022   Lab Results  Component Value Date   NA 140 01/11/2022   K 4.6 01/11/2022   CO2 21 01/11/2022   GLUCOSE 88 01/11/2022   BUN 18 01/11/2022   CREATININE 1.15 01/11/2022   BILITOT <0.2 01/11/2022   ALKPHOS 83 01/11/2022   AST 24 01/11/2022   ALT 23 01/11/2022   PROT 7.0 01/11/2022   ALBUMIN 4.3 01/11/2022   CALCIUM 9.6 01/11/2022   ANIONGAP 8 12/30/2018   EGFR 74 01/11/2022   Lab Results  Component Value Date   CHOL 170 01/11/2022   Lab Results  Component Value Date   HDL 27 (L) 01/11/2022   Lab Results  Component Value Date   LDLCALC 93 01/11/2022   Lab Results  Component Value Date   TRIG 298 (H) 01/11/2022   Lab Results  Component Value Date   CHOLHDL 6.3 (H) 01/11/2022   Lab Results  Component Value Date   HGBA1C 6.3 09/06/2022      Assessment & Plan:   Problem List Items Addressed This Visit       Cardiovascular and Mediastinum   Essential hypertension - Primary    BP Readings from Last 3 Encounters:  09/06/22 (!) 147/64  08/06/22 (!) 166/81  07/25/22 (!) 163/78  Continue amlodipine 2.5 mg daily, reported that he has been taking the medication daily as ordered but he has not taken the meds today, DASH diet advised engage in regular moderate to vigorous exercise with at least 150 minutes weekly. Follow-up in 4 months        Respiratory   Chronic sinusitis    Patient complains of stuffy nose, sneezing congestion, no complaints  of fever, chills, shortness of breath.  He has not been taking Flonase Flonase nasal spray 2 spray daily into both nares reordered.       Relevant Medications   fluticasone (FLONASE) 50 MCG/ACT nasal spray     Musculoskeletal and Integument   Strain of extensor structure of right middle finger at hand level    Patient told to take ibuprofen twice daily as needed, alternate with Tylenol 650 mg every 6 hours as needed.       Relevant Medications   cyclobenzaprine (FLEXERIL) 5 MG tablet   ibuprofen (ADVIL) 800 MG tablet     Other   Chronic pain of both knees    Chronic condition - cyclobenzaprine (FLEXERIL) 5 MG tablet; Take 1 tablet (5 mg total) by mouth 3 (three) times daily as needed for muscle spasms.  Dispense: 30 tablet; Refill: 1 - ibuprofen (ADVIL) 800 MG tablet; Take 1 tablet (800 mg total) by mouth 2 (two) times daily as needed.  Dispense: 20 tablet; Refill: 0 He was encouraged to alternate ibuprofen with tylenol due to his HTN.  Engage in regular daily exercises as tolerated          Relevant Medications   cyclobenzaprine (FLEXERIL) 5 MG tablet   ibuprofen (ADVIL) 800 MG tablet   Prediabetes    Lab Results  Component Value Date   HGBA1C 6.3 09/06/2022  Avoid sugar sweets soda Engage in regular moderate to vigorous exercises  at least 150 minutes weekly as tolerated      Relevant Orders   POCT glycosylated hemoglobin (Hb A1C) (Completed)    Meds ordered this encounter  Medications   fluticasone (FLONASE) 50 MCG/ACT nasal spray    Sig: PLACE 2 SPRAYS INTO BOTH NOSTRILS DAILY.    Dispense:  16 g    Refill:  0   DISCONTD: ibuprofen (ADVIL) 800 MG tablet    Sig: Take 1 tablet (800 mg total) by mouth in the morning and at bedtime.    Dispense:  26 tablet    Refill:  0   cyclobenzaprine (FLEXERIL) 5 MG tablet    Sig: Take 1 tablet (5 mg total) by mouth 3 (three) times daily as needed for muscle spasms.    Dispense:  30 tablet    Refill:  1   ibuprofen  (ADVIL) 800 MG tablet    Sig: Take 1 tablet (800 mg total) by mouth 2 (two) times daily as needed.    Dispense:  20 tablet    Refill:  0    Follow-up: No follow-ups on file.    Renee Rival, FNP

## 2022-09-06 NOTE — Assessment & Plan Note (Addendum)
Chronic condition - cyclobenzaprine (FLEXERIL) 5 MG tablet; Take 1 tablet (5 mg total) by mouth 3 (three) times daily as needed for muscle spasms.  Dispense: 30 tablet; Refill: 1 - ibuprofen (ADVIL) 800 MG tablet; Take 1 tablet (800 mg total) by mouth 2 (two) times daily as needed.  Dispense: 20 tablet; Refill: 0 He was encouraged to alternate ibuprofen with tylenol due to his HTN.  Engage in regular daily exercises as tolerated

## 2022-09-06 NOTE — Assessment & Plan Note (Signed)
BP Readings from Last 3 Encounters:  09/06/22 (!) 147/64  08/06/22 (!) 166/81  07/25/22 (!) 163/78  Continue amlodipine 2.5 mg daily, reported that he has been taking the medication daily as ordered but he has not taken the meds today, DASH diet advised engage in regular moderate to vigorous exercise with at least 150 minutes weekly. Follow-up in 4 months

## 2022-09-06 NOTE — Assessment & Plan Note (Signed)
Patient told to take ibuprofen twice daily as needed, alternate with Tylenol 650 mg every 6 hours as needed.

## 2022-10-09 ENCOUNTER — Other Ambulatory Visit: Payer: Self-pay | Admitting: Nurse Practitioner

## 2022-10-09 DIAGNOSIS — I10 Essential (primary) hypertension: Secondary | ICD-10-CM

## 2022-11-28 ENCOUNTER — Other Ambulatory Visit: Payer: Self-pay | Admitting: Nurse Practitioner

## 2022-11-28 DIAGNOSIS — G8929 Other chronic pain: Secondary | ICD-10-CM

## 2022-11-28 DIAGNOSIS — S66312A Strain of extensor muscle, fascia and tendon of right middle finger at wrist and hand level, initial encounter: Secondary | ICD-10-CM

## 2022-11-28 NOTE — Telephone Encounter (Signed)
Please advise KH 

## 2022-12-27 ENCOUNTER — Other Ambulatory Visit: Payer: Self-pay | Admitting: Nurse Practitioner

## 2022-12-27 DIAGNOSIS — S66312A Strain of extensor muscle, fascia and tendon of right middle finger at wrist and hand level, initial encounter: Secondary | ICD-10-CM

## 2022-12-27 DIAGNOSIS — I10 Essential (primary) hypertension: Secondary | ICD-10-CM

## 2022-12-27 DIAGNOSIS — G8929 Other chronic pain: Secondary | ICD-10-CM

## 2022-12-27 NOTE — Telephone Encounter (Signed)
Please advise KH 

## 2023-01-02 ENCOUNTER — Other Ambulatory Visit: Payer: Self-pay

## 2023-01-02 ENCOUNTER — Telehealth: Payer: Self-pay

## 2023-01-02 MED ORDER — BLOOD PRESSURE MONITOR DEVI: 0 refills | Status: AC

## 2023-01-02 NOTE — Progress Notes (Addendum)
   Alex [name] 29-Dec-1965 [id number]  Patient outreached by Buddy Duty , PharmD Candidate on 01/02/2023.  Blood Pressure Readings: Last documented ambulatory systolic blood pressure: 147 Last documented ambulatory diastolic blood pressure: 64 Does the patient have a validated home blood pressure machine?: No Pt has BP checked every Sat at [facility] or [facility]. Last Sat reading was 148/72. Goal BP of 130/80 was discussed with the pt. He has interest in having his own home BP machine.  Medication review was performed. Is the patient taking their medications as prescribed?: Yes  The following barriers to adherence were noted: Does the patient have cost concerns?: No Does the patient need assistance obtaining refills?: Yes Does the patient occassionally forget to take some of their prescribed medications?: No Does the patient feel like one/some of their medications make them feel poorly?: No Does the patient have questions or concerns about their medications?: No Does the patient have a follow up scheduled with their primary care provider/cardiologist?: Yes  Patient mentions that he needs more of his cyclobenzaprine.  Interventions: Interventions Completed: Medications were reviewed  The patient has follow up scheduled:  PCP: Donell Beers, FNP   Buddy Duty, Student-PharmD

## 2023-01-02 NOTE — Progress Notes (Signed)
Patient attempted to be outreached by Cashius Grandstaff, PharmD Candidate on 01/02/23 to discuss hypertension. Left voicemail for patient to return our call at their convenience at [phone].  Giacomo Valone, Student-PharmD 

## 2023-01-03 ENCOUNTER — Ambulatory Visit: Payer: Self-pay | Admitting: Nurse Practitioner

## 2023-01-22 ENCOUNTER — Other Ambulatory Visit: Payer: Self-pay

## 2023-01-22 ENCOUNTER — Encounter: Payer: Self-pay | Admitting: Nurse Practitioner

## 2023-01-22 ENCOUNTER — Ambulatory Visit: Payer: 59 | Attending: Nurse Practitioner | Admitting: Nurse Practitioner

## 2023-01-22 VITALS — BP 130/67 | HR 69 | Ht 73.0 in | Wt 286.2 lb

## 2023-01-22 DIAGNOSIS — I10 Essential (primary) hypertension: Secondary | ICD-10-CM

## 2023-01-22 DIAGNOSIS — S66312A Strain of extensor muscle, fascia and tendon of right middle finger at wrist and hand level, initial encounter: Secondary | ICD-10-CM

## 2023-01-22 DIAGNOSIS — M25561 Pain in right knee: Secondary | ICD-10-CM | POA: Diagnosis not present

## 2023-01-22 DIAGNOSIS — T50905A Adverse effect of unspecified drugs, medicaments and biological substances, initial encounter: Secondary | ICD-10-CM

## 2023-01-22 DIAGNOSIS — E785 Hyperlipidemia, unspecified: Secondary | ICD-10-CM

## 2023-01-22 DIAGNOSIS — G8929 Other chronic pain: Secondary | ICD-10-CM | POA: Diagnosis not present

## 2023-01-22 DIAGNOSIS — M25562 Pain in left knee: Secondary | ICD-10-CM

## 2023-01-22 DIAGNOSIS — F17200 Nicotine dependence, unspecified, uncomplicated: Secondary | ICD-10-CM

## 2023-01-22 DIAGNOSIS — Z23 Encounter for immunization: Secondary | ICD-10-CM | POA: Diagnosis not present

## 2023-01-22 MED ORDER — ZOSTER VAC RECOMB ADJUVANTED 50 MCG/0.5ML IM SUSR
0.5000 mL | Freq: Once | INTRAMUSCULAR | 0 refills | Status: AC
  Filled 2023-01-22: qty 0.5, 1d supply, fill #0

## 2023-01-22 MED ORDER — ATORVASTATIN CALCIUM 20 MG PO TABS
20.0000 mg | ORAL_TABLET | Freq: Every day | ORAL | 1 refills | Status: DC
Start: 2023-01-22 — End: 2023-01-24
  Filled 2023-01-22: qty 30, 30d supply, fill #0

## 2023-01-22 MED ORDER — MELOXICAM 15 MG PO TABS
7.5000 mg | ORAL_TABLET | Freq: Every day | ORAL | 1 refills | Status: AC
  Filled 2023-01-22: qty 30, 30d supply, fill #0

## 2023-01-22 MED ORDER — MELOXICAM 15 MG PO TABS
7.5000 mg | ORAL_TABLET | Freq: Every day | ORAL | 0 refills | Status: DC
  Filled 2023-01-22: qty 30, 30d supply, fill #0

## 2023-01-22 MED ORDER — CYCLOBENZAPRINE HCL 5 MG PO TABS
5.0000 mg | ORAL_TABLET | Freq: Three times a day (TID) | ORAL | 1 refills | Status: AC | PRN
Start: 2023-01-22 — End: ?
  Filled 2023-01-22: qty 30, 10d supply, fill #0

## 2023-01-22 NOTE — Progress Notes (Unsigned)
Assessment & Plan:  Steven was seen today for medication reaction.  Diagnoses and all orders for this visit:  Primary hypertension Resume amlodipine 2.5mg  daily  Adverse effect of drug, initial encounter STOP LIPITOR  Need for shingles vaccine -     Zoster Vaccine Adjuvanted [facility]) injection; Inject 0.5 mLs into the muscle once for 1 dose.  Chronic pain of both knees -     meloxicam (MOBIC) 15 MG tablet; Take 0.5-1 tablets (7.5-15 mg total) by mouth daily. KNEE PAIN -     cyclobenzaprine (FLEXERIL) 5 MG tablet; Take 1 tablet (5 mg total) by mouth 3 (three) times daily as needed for muscle spasms. Work on losing weight to help reduce joint pain. May alternate with heat and ice application for pain relief. May also alternate with acetaminophen and Ibuprofen as prescribed pain relief. Other alternatives include massage, acupuncture and water aerobics.  Tobacco dependence -     CT CHEST LUNG CA SCREEN LOW DOSE W/O CM; Future  Dyslipidemia, goal LDL below 100 HOLD atorvastatin  -     omega-3 acid ethyl esters (LOVAZA) 1 g capsule; Take 1 capsule (1 g total) by mouth 2 (two) times daily.      Patient has been counseled on age-appropriate routine health concerns for screening and prevention. These are reviewed and up-to-date. Referrals have been placed accordingly. Immunizations are up-to-date or declined.    Subjective:   Chief Complaint  Patient presents with   Medication Reaction   HPI Alex [name] 57 y.o. male presents to office today for medication reaction.    Past medical history of bilateral knee pain, hypertension, hyperlipidemia, tobaccos abuse presents for follow up for hypertension  HTN Mr. Evoy states he stopped taking amlodipine 2.5 and atorvastatin as he was experiencing myalgias/muscle cramps. Average home blood pressure readings:  140-150/70s BP Readings from Last 3 Encounters:  01/22/23 130/67  09/06/22 (!) 147/64  08/06/22 (!) 166/81      Joint Pain Endorses generalized arthralgias in bilateral knees and hands. Has been taking ibuprofen 800 mg BID.   The 10-year ASCVD risk score (Arnett DK, et al., 2019) is: 24.7%   Values used to calculate the score:     Age: 34 years     Sex: Male     Is Non-Hispanic African American: Yes     Diabetic: No     Tobacco smoker: Yes     Systolic Blood Pressure: 130 mmHg     Is BP treated: Yes     HDL Cholesterol: 27 mg/dL     Total Cholesterol: 170 mg/dL   Review of Systems  Constitutional:  Negative for fever, malaise/fatigue and weight loss.  HENT: Negative.  Negative for nosebleeds.   Eyes: Negative.  Negative for blurred vision, double vision and photophobia.  Respiratory: Negative.  Negative for cough and shortness of breath.   Cardiovascular: Negative.  Negative for chest pain, palpitations and leg swelling.  Gastrointestinal: Negative.  Negative for heartburn, nausea and vomiting.  Musculoskeletal:  Positive for joint pain and myalgias.  Neurological: Negative.  Negative for dizziness, focal weakness, seizures and headaches.  Psychiatric/Behavioral: Negative.  Negative for suicidal ideas.     Past Medical History:  Diagnosis Date   Bone spur    Flu    Hypertension    Kidney stone    Prediabetes     Past Surgical History:  Procedure Laterality Date   ELBOW SURGERY  2019    Family History  Problem Relation Age of  Onset   Hypertension Mother    Kidney disease Mother    Heart disease Mother    Cancer Father     Social History Reviewed with no changes to be made today.   Outpatient Medications Prior to Visit  Medication Sig Dispense Refill   amLODipine (NORVASC) 2.5 MG tablet TAKE 1 TABLET BY MOUTH DAILY 28 tablet 2   Blood Pressure Monitor DEVI Use to check blood pressure daily. XL cuff, if covered. I10.0 1 each 0   fluticasone (FLONASE) 50 MCG/ACT nasal spray PLACE 2 SPRAYS INTO BOTH NOSTRILS DAILY. 16 g 0   atorvastatin (LIPITOR) 20 MG tablet TAKE 1  TABLET (20 MG TOTAL) BY MOUTH DAILY. 90 tablet 1   cyclobenzaprine (FLEXERIL) 5 MG tablet Take 1 tablet (5 mg total) by mouth 3 (three) times daily as needed for muscle spasms. 30 tablet 1   ibuprofen (ADVIL) 800 MG tablet TAKE 1 TABLET BY MOUTH TWICE A DAY AS NEEDED 20 tablet 0   No facility-administered medications prior to visit.    No Known Allergies     Objective:    BP 130/67 (BP Location: Left Arm, Patient Position: Sitting, Cuff Size: Large)   Pulse 69   Ht 6\' 1"  (1.854 m)   Wt 286 lb 3.2 oz (129.8 kg)   SpO2 98%   BMI 37.76 kg/m  Wt Readings from Last 3 Encounters:  01/22/23 286 lb 3.2 oz (129.8 kg)  09/06/22 286 lb (129.7 kg)  07/25/22 284 lb (128.8 kg)    Physical Exam Vitals and nursing note reviewed.  Constitutional:      Appearance: He is well-developed.  HENT:     Head: Normocephalic and atraumatic.  Cardiovascular:     Rate and Rhythm: Normal rate and regular rhythm.     Heart sounds: Normal heart sounds. No murmur heard.    No friction rub. No gallop.  Pulmonary:     Effort: Pulmonary effort is normal. No tachypnea or respiratory distress.     Breath sounds: Normal breath sounds. No decreased breath sounds, wheezing, rhonchi or rales.  Chest:     Chest wall: No tenderness.  Abdominal:     General: Bowel sounds are normal.     Palpations: Abdomen is soft.  Musculoskeletal:        General: Normal range of motion.     Cervical back: Normal range of motion.  Skin:    General: Skin is warm and dry.  Neurological:     Mental Status: He is alert and oriented to person, place, and time.     Coordination: Coordination normal.  Psychiatric:        Behavior: Behavior normal. Behavior is cooperative.        Thought Content: Thought content normal.        Judgment: Judgment normal.          Patient has been counseled extensively about nutrition and exercise as well as the importance of adherence with medications and regular follow-up. The patient was  given clear instructions to go to ER or return to medical center if symptoms don't improve, worsen or new problems develop. The patient verbalized understanding.   Follow-up: Return for BP RECHECK with me may double book 1110 (3-4 weeks).   Claiborne Rigg, FNP-BC [facility] [place], [place] [phone]   01/24/2023, 9:22 PM

## 2023-01-24 ENCOUNTER — Encounter: Payer: Self-pay | Admitting: Nurse Practitioner

## 2023-01-24 MED ORDER — OMEGA-3-ACID ETHYL ESTERS 1 G PO CAPS
1.0000 g | ORAL_CAPSULE | Freq: Two times a day (BID) | ORAL | 1 refills | Status: AC
Start: 2023-01-24 — End: ?
  Filled 2023-01-24: qty 60, 30d supply, fill #0

## 2023-01-25 ENCOUNTER — Other Ambulatory Visit: Payer: Self-pay

## 2023-01-31 ENCOUNTER — Other Ambulatory Visit: Payer: Self-pay

## 2023-02-21 ENCOUNTER — Ambulatory Visit: Payer: Self-pay | Admitting: Nurse Practitioner

## 2023-03-26 ENCOUNTER — Ambulatory Visit: Payer: Self-pay

## 2023-03-26 NOTE — Telephone Encounter (Signed)
Patient last seen by Z. Fleming on 01/22/2023. Patient PCP is listed as Donell Beers, FNP .  Spoke with patient . Patient voiced that he is requesting a letter from his Doctor that states the due to his physical condition he can not work in [employer] . Pt. Voices that he has a court case on the 04/06/2023 for child support and another later on the month for disability and would like it before court. Asked patient if he knew who his primary doctor was. Patient is unsure as he said he has only been to see them a couple of times.

## 2023-03-26 NOTE — Telephone Encounter (Signed)
  Chief Complaint: Arthritis Pain  Symptoms: H/O arthritis hands, knees. Ankles are swollen "Nothing new, goes down at night." Frequency: 2 years Pertinent Negatives: Patient denies  Disposition: [] ED /[] Urgent Care (no appt availability in office) / [] Appointment(In office/virtual)/ []  [facility]/ [] Home Care/ [] Refused Recommended Disposition /[] [facility]/ [x]  Follow-up with PCP Additional Notes: Pt calling for appt with Zelda. States he has a court date Aug. 30th and needs a letter "With all those conditions because I cannot work in [employer] anymore." States attorney asked him to secure letter from his PCP. No appts available, court date 04/06/23. Did not know he had missed appt 02/21/23. "Usually get a reminder"  Apologizes. Last saw Zelda 01/22/23 Please advise. Reason for Disposition  Knee pain is a chronic symptom (recurrent or ongoing AND present > 4 weeks)  Answer Assessment - Initial Assessment Questions 1. LOCATION and RADIATION: "Where is the pain located?"      *No Answer* 2. QUALITY: "What does the pain feel like?"  (e.g., sharp, dull, aching, burning)     *No Answer* 3. SEVERITY: "How bad is the pain?" "What does it keep you from doing?"   (Scale 1-10; or mild, moderate, severe)   -  MILD (1-3): doesn't interfere with normal activities    -  MODERATE (4-7): interferes with normal activities (e.g., work or school) or awakens from sleep, limping    -  SEVERE (8-10): excruciating pain, unable to do any normal activities, unable to walk     *No Answer* 4. ONSET: "When did the pain start?" "Does it come and go, or is it there all the time?"     2 years 5. RECURRENT: "Have you had this pain before?" If Yes, ask: "When, and what happened then?"     *No Answer* 6. SETTING: "Has there been any recent work, exercise or other activity that involved that part of the body?"      *No Answer* 7. AGGRAVATING FACTORS: "What makes the knee pain worse?" (e.g.,  walking, climbing stairs, running)     *No Answer* 8. ASSOCIATED SYMPTOMS: "Is there any swelling or redness of the knee?"     *No Answer* 9. OTHER SYMPTOMS: "Do you have any other symptoms?" (e.g., chest pain, difficulty breathing, fever, calf pain)     *No Answer*  Protocols used: Knee Pain-A-AH

## 2023-03-26 NOTE — Telephone Encounter (Signed)
Patient called, left VM to return the call to the office to speak to the NT.   Summary: ankles are swelling.   Pt stated he is having pain in knees they pop due to inflammation. Pain in his hands can't make a fist with his hand, and ankles are swelling. He stated he was supposed to come back for a f/u but did not get any reminders advised he had a no show for 07/17. Pt stated he needs an appointment to see PCP Bertram Denver, as soon as possible.  Mentioned needs a letter with information on all his medical issues as he cannot work in [employer] anymore.   No appointment is available.  Seeking clinical advice.

## 2023-03-27 ENCOUNTER — Encounter: Payer: Self-pay | Admitting: Nurse Practitioner

## 2023-03-27 NOTE — Telephone Encounter (Signed)
Patient was notified via voicemail that letter is at front desk to pick up. Given office hours as well. MyChart status is pending.

## 2023-03-27 NOTE — Telephone Encounter (Signed)
Letter is in mychart.

## 2023-03-28 ENCOUNTER — Telehealth (INDEPENDENT_AMBULATORY_CARE_PROVIDER_SITE_OTHER): Payer: Self-pay | Admitting: Nurse Practitioner

## 2023-03-28 NOTE — Telephone Encounter (Signed)
Error

## 2023-03-28 NOTE — Telephone Encounter (Signed)
Copied from CRM [id number]. Topic: General - Other >> Mar 28, 2023 10:55 AM Dondra Prader A wrote: Reason for CRM: Pt is calling back to see if his letter can be sent back to his mychart since it is active and not pending anymore. Pt states if it cannot be put back in the mychart to please call him to let him know to pick up the letter. Pt also states that he seen in mychart two of his medications is due for a refill soon but cannot see which two medications it is. Please advise.

## 2023-03-29 ENCOUNTER — Encounter: Payer: Self-pay | Admitting: *Deleted

## 2023-03-29 ENCOUNTER — Telehealth: Payer: Self-pay | Admitting: Nurse Practitioner

## 2023-03-29 NOTE — Telephone Encounter (Signed)
Patient called to f/u on the letter that he needs uploaded to his mychart. Please f/u with patient

## 2023-03-29 NOTE — Telephone Encounter (Signed)
This message was sent to patient in MyChart regarding letter for work.   See below:    Hi Mr. Alex [name].     Good afternoon.    Your letter is located in the letters section of MyChart.   I will resend it but if you have any problems please reach out to the MyChart support team at [phone].    Kind regards,  Eustace Pen

## 2023-04-30 ENCOUNTER — Other Ambulatory Visit: Payer: Self-pay

## 2023-04-30 ENCOUNTER — Other Ambulatory Visit: Payer: Self-pay | Admitting: Nurse Practitioner

## 2023-04-30 ENCOUNTER — Other Ambulatory Visit: Payer: Self-pay | Admitting: Pharmacist

## 2023-04-30 DIAGNOSIS — I10 Essential (primary) hypertension: Secondary | ICD-10-CM

## 2023-04-30 MED ORDER — AMLODIPINE BESYLATE 2.5 MG PO TABS
2.5000 mg | ORAL_TABLET | Freq: Every day | ORAL | 0 refills | Status: AC
Start: 2023-04-30 — End: ?
  Filled 2023-04-30: qty 30, 30d supply, fill #0

## 2023-04-30 NOTE — Telephone Encounter (Signed)
Medication Refill - Medication: amLODipine (NORVASC) 2.5 MG tablet   Has the patient contacted their pharmacy? No. (Agent: If no, request that the patient contact the pharmacy for the refill. If patient does not wish to contact the pharmacy document the reason why and proceed with request.)   Preferred Pharmacy (with phone number or street name):  [facility] [address], [place] - [address]  [address] [place] [address]  Phone: [phone] Fax: [phone]  Hours: Not open 24 hours   Has the patient been seen for an appointment in the last year OR does the patient have an upcoming appointment? Yes.    Agent: Please be advised that RX refills may take up to 3 business days. We ask that you follow-up with your pharmacy.

## 2023-04-30 NOTE — Telephone Encounter (Signed)
Noted  

## 2023-04-30 NOTE — Telephone Encounter (Signed)
Pt stated he will be coming by to pick up letter today.   Please advise.

## 2023-05-03 DIAGNOSIS — I672 Cerebral atherosclerosis: Secondary | ICD-10-CM | POA: Diagnosis not present

## 2023-05-03 DIAGNOSIS — J3489 Other specified disorders of nose and nasal sinuses: Secondary | ICD-10-CM | POA: Diagnosis not present

## 2023-05-03 DIAGNOSIS — R519 Headache, unspecified: Secondary | ICD-10-CM | POA: Diagnosis not present

## 2023-05-03 DIAGNOSIS — J321 Chronic frontal sinusitis: Secondary | ICD-10-CM | POA: Diagnosis not present

## 2023-05-03 DIAGNOSIS — F17210 Nicotine dependence, cigarettes, uncomplicated: Secondary | ICD-10-CM | POA: Diagnosis not present

## 2023-05-03 DIAGNOSIS — Z8249 Family history of ischemic heart disease and other diseases of the circulatory system: Secondary | ICD-10-CM | POA: Diagnosis not present

## 2023-05-18 ENCOUNTER — Ambulatory Visit: Payer: 59 | Admitting: Nurse Practitioner

## 2024-08-21 ENCOUNTER — Telehealth: Payer: Self-pay

## 2024-08-21 NOTE — Transitions of Care (Post Inpatient/ED Visit) (Signed)
" ° °  08/21/2024  Name: Alex [name] MRN: [id number] DOB: 05/25/1966  Today's TOC FU Call Status:    Attempted to reach the patient regarding the most recent Inpatient/ED visit.  Follow Up Plan: No further outreach attempts will be made at this time. We have been unable to contact the patient. Due to number not in service.  Signature Geroge Hahn, CMA   "
